# Patient Record
Sex: Male | Born: 1945 | Race: Black or African American | Hispanic: No | Marital: Single | Smoking: Former smoker
Health system: Southern US, Community
[De-identification: ages and names within clinical notes are randomized; demographics above are authoritative.]

## PROBLEM LIST (undated history)

## (undated) DIAGNOSIS — I10 Essential (primary) hypertension: Secondary | ICD-10-CM

## (undated) DIAGNOSIS — H269 Unspecified cataract: Secondary | ICD-10-CM

## (undated) DIAGNOSIS — R339 Retention of urine, unspecified: Secondary | ICD-10-CM

## (undated) HISTORY — PX: OTHER SURGICAL HISTORY: SHX169

## (undated) HISTORY — DX: Unspecified cataract: H26.9

---

## 2014-11-09 ENCOUNTER — Emergency Department (HOSPITAL_COMMUNITY)
Admission: EM | Admit: 2014-11-09 | Discharge: 2014-11-09 | Disposition: A | Discharge status: Home / Self Care | Payer: Commercial Managed Care - HMO | Attending: Emergency Medicine | Admitting: Emergency Medicine

## 2014-11-09 DIAGNOSIS — R339 Retention of urine, unspecified: Secondary | ICD-10-CM

## 2014-11-09 DIAGNOSIS — Z79899 Other long term (current) drug therapy: Secondary | ICD-10-CM | POA: Insufficient documentation

## 2014-11-09 LAB — URINALYSIS, ROUTINE W REFLEX MICROSCOPIC
Bilirubin Urine: NEGATIVE
Glucose, UA: NEGATIVE mg/dL
Ketones, ur: NEGATIVE mg/dL
LEUKOCYTES UA: NEGATIVE
Nitrite: NEGATIVE
Protein, ur: NEGATIVE mg/dL
Specific Gravity, Urine: 1.008 (ref 1.005–1.030)
Urobilinogen, UA: 0.2 mg/dL (ref 0.0–1.0)
pH: 5 (ref 5.0–8.0)

## 2014-11-09 LAB — URINE MICROSCOPIC-ADD ON

## 2014-11-09 NOTE — ED Provider Notes (Signed)
CSN: [id number]     Arrival date & time 11/09/14  1045 History   First MD Initiated Contact with Patient 11/09/14 1049     Chief Complaint  Patient presents with  . Urinary Retention      HPI Per EMS- Patient c/o not urinating since midnight. Patient states he has an appointment at the [facility] on 11-20-14 to have a prostate check.. Patient has a history of HTN and did take his BP meds this AM No past medical history on file. No past surgical history on file. No family history on file. History  Substance Use Topics  . Smoking status: Not on file  . Smokeless tobacco: Not on file  . Alcohol Use: Not on file    Review of Systems All other systems reviewed and are negative   Allergies  Review of patient's allergies indicates no known allergies.  Home Medications   Prior to Admission medications   Medication Sig Start Date End Date Taking? Authorizing Provider  AMLODIPINE BESYLATE PO Take 1 tablet by mouth.   Yes Historical Provider, MD  LISINOPRIL PO Take 1 tablet by mouth daily.   Yes Historical Provider, MD  METOPROLOL TARTRATE PO Take by mouth.   Yes Historical Provider, MD   BP 205/96 mmHg  Pulse 76  Temp(Src) 98.7 F (37.1 C) (Oral)  Resp 16  SpO2 96% Physical Exam Physical Exam  Nursing note and vitals reviewed. Constitutional: He is oriented to person, place, and time. He appears well-developed and well-nourished. No distress.  HENT:  Head: Normocephalic and atraumatic.  Eyes: Pupils are equal, round, and reactive to light.  Neck: Normal range of motion.  Cardiovascular: Normal rate and intact distal pulses.   Pulmonary/Chest: No respiratory distress.  Abdominal: Normal appearance.  Suprapubic distention noted.  No rebound guarding tenderness.  Active bowel sounds. Musculoskeletal: Normal range of motion.  Neurological: He is alert and oriented to person, place, and time. No cranial nerve deficit.  Skin: Skin is warm and dry. No rash noted.  Psychiatric: He has a  normal mood and affect. His behavior is normal.   ED Course  Procedures (including critical care time)  Foley catheter placed.  Greater than 1000 cc of urine returned.  Patient feels much improved.  Wants to go home.  Will leave Foley in place with leg bag.  We'll arrange for follow-up with urology. Labs Review Labs Reviewed  URINALYSIS, ROUTINE W REFLEX MICROSCOPIC - Abnormal; Notable for the following:    Hgb urine dipstick LARGE (*)    All other components within normal limits  URINE MICROSCOPIC-ADD ON      MDM   Final diagnoses:  Urinary retention        Nelia Shi, MD 11/09/14 1227

## 2014-11-09 NOTE — ED Notes (Addendum)
Per EMS- Patient c/o not urinating since midnight. Patient states he has an appointment at the [facility] on 11-20-14 to have a prostate check.. Patient has a history of HTN and did take his BP meds this AM. BP-204/82.

## 2014-11-09 NOTE — Discharge Instructions (Signed)
Acute Urinary Retention °Acute urinary retention is the temporary inability to urinate. °This is a common problem in older men. As men age their prostates become larger and block the flow of urine from the bladder. This is usually a problem that has come on gradually.  °HOME CARE INSTRUCTIONS °If you are sent home with a Foley catheter and a drainage system, you will need to discuss the best course of action with your health care provider. While the catheter is in, maintain a good intake of fluids. Keep the drainage bag emptied and lower than your catheter. This is so that contaminated urine will not flow back into your bladder, which could lead to a urinary tract infection. °There are two main types of drainage bags. One is a large bag that usually is used at night. It has a good capacity that will allow you to sleep through the night without having to empty it. The second type is called a leg bag. It has a smaller capacity, so it needs to be emptied more frequently. However, the main advantage is that it can be attached by a leg strap and can go underneath your clothing, allowing you the freedom to move about or leave your home. °Only take over-the-counter or prescription medicines for pain, discomfort, or fever as directed by your health care provider.  °SEEK MEDICAL CARE IF: °· You develop a low-grade fever. °· You experience spasms or leakage of urine with the spasms. °SEEK IMMEDIATE MEDICAL CARE IF:  °· You develop chills or fever. °· Your catheter stops draining urine. °· Your catheter falls out. °· You start to develop increased bleeding that does not respond to rest and increased fluid intake. °MAKE SURE YOU: °· Understand these instructions. °· Will watch your condition. °· Will get help right away if you are not doing well or get worse. °Document Released: 02/01/2001 Document Revised: 10/31/2013 Document Reviewed: 04/06/2013 °ExitCare® Patient Information ©2015 [employer]. This information is not  intended to replace advice given to you by your health care provider. Make sure you discuss any questions you have with your health care provider. ° °

## 2014-12-03 ENCOUNTER — Encounter (HOSPITAL_COMMUNITY): Payer: Self-pay | Admitting: Emergency Medicine

## 2014-12-03 ENCOUNTER — Emergency Department (HOSPITAL_COMMUNITY)
Admission: EM | Admit: 2014-12-03 | Discharge: 2014-12-03 | Disposition: A | Discharge status: Home / Self Care | Payer: Commercial Managed Care - HMO | Attending: Emergency Medicine | Admitting: Emergency Medicine

## 2014-12-03 DIAGNOSIS — Z79899 Other long term (current) drug therapy: Secondary | ICD-10-CM | POA: Diagnosis not present

## 2014-12-03 DIAGNOSIS — Z720 Tobacco use: Secondary | ICD-10-CM | POA: Diagnosis not present

## 2014-12-03 DIAGNOSIS — I10 Essential (primary) hypertension: Secondary | ICD-10-CM | POA: Insufficient documentation

## 2014-12-03 DIAGNOSIS — Z466 Encounter for fitting and adjustment of urinary device: Secondary | ICD-10-CM

## 2014-12-03 HISTORY — DX: Essential (primary) hypertension: I10

## 2014-12-03 NOTE — Discharge Instructions (Signed)
Foley Catheter Care °A Foley catheter is a soft, flexible tube that is placed into the bladder to drain urine. A Foley catheter may be inserted if: °· You leak urine or are not able to control when you urinate (urinary incontinence). °· You are not able to urinate when you need to (urinary retention). °· You had prostate surgery or surgery on the genitals. °· You have certain medical conditions, such as multiple sclerosis, dementia, or a spinal cord injury. °If you are going home with a Foley catheter in place, follow the instructions below. °TAKING CARE OF THE CATHETER °1. Wash your hands with soap and water. °2. Using mild soap and warm water on a clean washcloth: °· Clean the area on your body closest to the catheter insertion site using a circular motion, moving away from the catheter. Never wipe toward the catheter because this could sweep bacteria up into the urethra and cause infection. °· Remove all traces of soap. Pat the area dry with a clean towel. For males, reposition the foreskin. °3. Attach the catheter to your leg so there is no tension on the catheter. Use adhesive tape or a leg strap. If you are using adhesive tape, remove any sticky residue left behind by the previous tape you used. °4. Keep the drainage bag below the level of the bladder, but keep it off the floor. °5. Check throughout the day to be sure the catheter is working and urine is draining freely. Make sure the tubing does not become kinked. °6. Do not pull on the catheter or try to remove it. Pulling could damage internal tissues. °TAKING CARE OF THE DRAINAGE BAGS °You will be given two drainage bags to take home. One is a large overnight drainage bag, and the other is a smaller leg bag that fits underneath clothing. You may wear the overnight bag at any time, but you should never wear the smaller leg bag at night. Follow the instructions below for how to empty, change, and clean your drainage bags. °Emptying the Drainage Bag °You must  empty your drainage bag when it is  -½ full or at least 2-3 times a day. °1. Wash your hands with soap and water. °2. Keep the drainage bag below your hips, below the level of your bladder. This stops urine from going back into the tubing and into your bladder. °3. Hold the dirty bag over the toilet or a clean container. °4. Open the pour spout at the bottom of the bag and empty the urine into the toilet or container. Do not let the pour spout touch the toilet, container, or any other surface. Doing so can place bacteria on the bag, which can cause an infection. °5. Clean the pour spout with a gauze pad or cotton ball that has rubbing alcohol on it. °6. Close the pour spout. °7. Attach the bag to your leg with adhesive tape or a leg strap. °8. Wash your hands well. °Changing the Drainage Bag °Change your drainage bag once a month or sooner if it starts to smell bad or look dirty. Below are steps to follow when changing the drainage bag. °1. Wash your hands with soap and water. °2. Pinch off the rubber catheter so that urine does not spill out. °3. Disconnect the catheter tube from the drainage tube at the connection valve. Do not let the tubes touch any surface. °4. Clean the end of the catheter tube with an alcohol wipe. Use a different alcohol wipe to clean the   end of the drainage tube. °5. Connect the catheter tube to the drainage tube of the clean drainage bag. °6. Attach the new bag to the leg with adhesive tape or a leg strap. Avoid attaching the new bag too tightly. °7. Wash your hands well. °Cleaning the Drainage Bag °1. Wash your hands with soap and water. °2. Wash the bag in warm, soapy water. °3. Rinse the bag thoroughly with warm water. °4. Fill the bag with a solution of white vinegar and water (1 cup vinegar to 1 qt warm water [.2 L vinegar to 1 L warm water]). Close the bag and soak it for 30 minutes in the solution. °5. Rinse the bag with warm water. °6. Hang the bag to dry with the pour spout open  and hanging downward. °7. Store the clean bag (once it is dry) in a clean plastic bag. °8. Wash your hands well. °PREVENTING INFECTION °· Wash your hands before and after handling your catheter. °· Take showers daily and wash the area where the catheter enters your body. Do not take baths. Replace wet leg straps with dry ones, if this applies. °· Do not use powders, sprays, or lotions on the genital area. Only use creams, lotions, or ointments as directed by your caregiver. °· For females, wipe from front to back after each bowel movement. °· Drink enough fluids to keep your urine clear or pale yellow unless you have a fluid restriction. °· Do not let the drainage bag or tubing touch or lie on the floor. °· Wear cotton underwear to absorb moisture and to keep your skin drier. °SEEK MEDICAL CARE IF:  °· Your urine is cloudy or smells unusually bad. °· Your catheter becomes clogged. °· You are not draining urine into the bag or your bladder feels full. °· Your catheter starts to leak. °SEEK IMMEDIATE MEDICAL CARE IF:  °· You have pain, swelling, redness, or pus where the catheter enters the body. °· You have pain in the abdomen, legs, lower back, or bladder. °· You have a fever. °· You see blood fill the catheter, or your urine is pink or red. °· You have nausea, vomiting, or chills. °· Your catheter gets pulled out. °MAKE SURE YOU:  °· Understand these instructions. °· Will watch your condition. °· Will get help right away if you are not doing well or get worse. °Document Released: 10/26/2005 Document Revised: 03/12/2014 Document Reviewed: 10/17/2012 °ExitCare® Patient Information ©2015 [employer]. This information is not intended to replace advice given to you by your health care provider. Make sure you discuss any questions you have with your health care provider. ° °
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
## 2014-12-03 NOTE — ED Notes (Signed)
[facility]  Pt was seen leaving the department w/ d/c paperwork.

## 2014-12-07 ENCOUNTER — Encounter (HOSPITAL_COMMUNITY): Payer: Self-pay

## 2014-12-07 ENCOUNTER — Emergency Department (HOSPITAL_COMMUNITY)
Admission: EM | Admit: 2014-12-07 | Discharge: 2014-12-07 | Disposition: A | Discharge status: Home / Self Care | Payer: Commercial Managed Care - HMO | Attending: Emergency Medicine | Admitting: Emergency Medicine

## 2014-12-07 DIAGNOSIS — Z720 Tobacco use: Secondary | ICD-10-CM | POA: Diagnosis not present

## 2014-12-07 DIAGNOSIS — R339 Retention of urine, unspecified: Secondary | ICD-10-CM | POA: Diagnosis present

## 2014-12-07 DIAGNOSIS — Z79899 Other long term (current) drug therapy: Secondary | ICD-10-CM | POA: Diagnosis not present

## 2014-12-07 DIAGNOSIS — N390 Urinary tract infection, site not specified: Secondary | ICD-10-CM | POA: Insufficient documentation

## 2014-12-07 DIAGNOSIS — I10 Essential (primary) hypertension: Secondary | ICD-10-CM | POA: Insufficient documentation

## 2014-12-07 HISTORY — DX: Retention of urine, unspecified: R33.9

## 2014-12-07 LAB — CBC WITH DIFFERENTIAL/PLATELET
Basophils Absolute: 0 10*3/uL (ref 0.0–0.1)
Basophils Relative: 1 % (ref 0–1)
Eosinophils Absolute: 0.1 10*3/uL (ref 0.0–0.7)
Eosinophils Relative: 3 % (ref 0–5)
HEMATOCRIT: 38.3 % — AB (ref 39.0–52.0)
HEMOGLOBIN: 12.4 g/dL — AB (ref 13.0–17.0)
LYMPHS PCT: 21 % (ref 12–46)
Lymphs Abs: 0.8 10*3/uL (ref 0.7–4.0)
MCH: 27.8 pg (ref 26.0–34.0)
MCHC: 32.4 g/dL (ref 30.0–36.0)
MCV: 85.9 fL (ref 78.0–100.0)
Monocytes Absolute: 0.4 10*3/uL (ref 0.1–1.0)
Monocytes Relative: 10 % (ref 3–12)
NEUTROS ABS: 2.5 10*3/uL (ref 1.7–7.7)
Neutrophils Relative %: 65 % (ref 43–77)
Platelets: 182 10*3/uL (ref 150–400)
RBC: 4.46 MIL/uL (ref 4.22–5.81)
RDW: 15.7 % — ABNORMAL HIGH (ref 11.5–15.5)
WBC: 3.9 10*3/uL — ABNORMAL LOW (ref 4.0–10.5)

## 2014-12-07 LAB — URINE MICROSCOPIC-ADD ON

## 2014-12-07 LAB — URINALYSIS, ROUTINE W REFLEX MICROSCOPIC
BILIRUBIN URINE: NEGATIVE
Glucose, UA: NEGATIVE mg/dL
KETONES UR: NEGATIVE mg/dL
Leukocytes, UA: NEGATIVE
NITRITE: POSITIVE — AB
PH: 5.5 (ref 5.0–8.0)
Protein, ur: NEGATIVE mg/dL
Specific Gravity, Urine: 1.019 (ref 1.005–1.030)
Urobilinogen, UA: 0.2 mg/dL (ref 0.0–1.0)

## 2014-12-07 LAB — BASIC METABOLIC PANEL
ANION GAP: 8 (ref 5–15)
BUN: 21 mg/dL (ref 6–23)
CALCIUM: 9.1 mg/dL (ref 8.4–10.5)
CHLORIDE: 107 mmol/L (ref 96–112)
CO2: 25 mmol/L (ref 19–32)
CREATININE: 1.1 mg/dL (ref 0.50–1.35)
GFR calc Af Amer: 78 mL/min — ABNORMAL LOW (ref >90)
GFR calc non Af Amer: 68 mL/min — ABNORMAL LOW (ref >90)
GLUCOSE: 94 mg/dL (ref 70–99)
Potassium: 4.1 mmol/L (ref 3.5–5.1)
Sodium: 140 mmol/L (ref 135–145)

## 2014-12-07 MED ORDER — LIDOCAINE HCL 2 % EX GEL
CUTANEOUS | Status: AC
  Filled 2014-12-07: qty 10

## 2014-12-07 MED ORDER — CEPHALEXIN 500 MG PO CAPS: 500.0000 mg | ORAL_CAPSULE | Freq: Two times a day (BID) | ORAL | Status: DC

## 2014-12-07 MED ORDER — CEFTRIAXONE SODIUM 1 G IJ SOLR
1.0000 g | Freq: Once | INTRAMUSCULAR | Status: AC
  Administered 2014-12-07: 1 g via INTRAMUSCULAR
  Filled 2014-12-07: qty 10

## 2014-12-07 MED ORDER — CEFTRIAXONE SODIUM 1 G IJ SOLR: 1.0000 g | Freq: Once | INTRAMUSCULAR | Status: DC

## 2014-12-07 NOTE — ED Notes (Signed)
Bed: [id number] Expected date:  Expected time:  Means of arrival: Ambulance Comments: 69 yo male Urinary symptoms
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
## 2014-12-07 NOTE — ED Notes (Signed)
Leg bag intact/ supplies [facility] at [employer]

## 2014-12-07 NOTE — ED Notes (Addendum)
Patient arrives to facility via EMS after he called reporting that he has not urinated x 4 days.  He denies pain, but says, "I feel some pressure at the end of my joint."  Seen for removal of Foley catheter on 12/03/14.  States it was placed on 11/09/14 for urinary retention.

## 2014-12-07 NOTE — Discharge Instructions (Signed)
°  Take your antibiotics as directed and to completion. You should never have any leftover antibiotics! Push fluids and stay well hydrated.  ° °Any antibiotic use can reduce the efficacy of hormonal birth control. Please use back up method of contraception.  ° °Please follow with your primary care doctor in the next 2 days for a check-up. They must obtain records for further management.  ° °Do not hesitate to return to the Emergency Department for any new, worsening or concerning symptoms.  ° °

## 2014-12-11 ENCOUNTER — Telehealth (HOSPITAL_BASED_OUTPATIENT_CLINIC_OR_DEPARTMENT_OTHER): Payer: Self-pay | Admitting: Emergency Medicine

## 2015-01-02 ENCOUNTER — Emergency Department (HOSPITAL_COMMUNITY)
Admission: EM | Admit: 2015-01-02 | Discharge: 2015-01-02 | Disposition: A | Discharge status: Home / Self Care | Payer: Commercial Managed Care - HMO | Attending: Emergency Medicine | Admitting: Emergency Medicine

## 2015-01-02 ENCOUNTER — Encounter (HOSPITAL_COMMUNITY): Payer: Self-pay | Admitting: Emergency Medicine

## 2015-01-02 DIAGNOSIS — R339 Retention of urine, unspecified: Secondary | ICD-10-CM | POA: Diagnosis not present

## 2015-01-02 DIAGNOSIS — Z720 Tobacco use: Secondary | ICD-10-CM | POA: Insufficient documentation

## 2015-01-02 DIAGNOSIS — Z79899 Other long term (current) drug therapy: Secondary | ICD-10-CM | POA: Insufficient documentation

## 2015-01-02 DIAGNOSIS — I10 Essential (primary) hypertension: Secondary | ICD-10-CM | POA: Insufficient documentation

## 2015-01-02 NOTE — ED Notes (Signed)
Took patient catheter out per MD

## 2015-01-02 NOTE — ED Notes (Addendum)
Pt from home and reports that he was seen at [facility] this a.m. For follow up for acute urinary retention. He reports they refused to see him due to lack of "$45 of copayment" he states "nobody is treating me for this". He denies problems with catheter and was able to empty his drainage bag this am and denies pain.

## 2015-01-02 NOTE — ED Notes (Signed)
I scan patient bladder it had 76ml in it.  I made nurse aware of it.

## 2015-01-02 NOTE — Progress Notes (Signed)
01/02/2015 A. Benno Brensinger RNCM 1617pm EDCM spoke [facility] for social worker for patient.  EDCM spoke to Dr. Littie DeedsGentry who placed order for home health social worker.  Belenda [facility]ristin [facility] made aware.  No further EDCM needs at this time.

## 2015-01-02 NOTE — ED Notes (Addendum)
Patient comes from home with questions about his urinary catheter.  Patient had catheter placed here on 1/29.  Patient went to see [facility] this morning, but they would not see him without a $45 co-pay, which he does not have.  Patient wants to know why he can't pass urine and why he must have a catheter.  Patient denies pain, SOB and dizziness.

## 2015-01-02 NOTE — ED Provider Notes (Signed)
CSN: [id number]     Arrival date & time 01/02/15  6578 History   First MD Initiated Contact with Patient 01/02/15 0848     Chief Complaint  Patient presents with  . catheter issue      (Consider location/radiation/quality/duration/timing/severity/associated sxs/prior Treatment) HPI Comments: 69 y.o. male presents with request to have foley removed.  He had this placed for urinary retention and was to fu with urology.  He went to urology today, but did not have the $45 copay, so presented here.  No abd pain.  No associated fever, nausea, vomiting, diarrhea, or other complaint.  Nothing provokes symptoms.     Past Medical History  Diagnosis Date  . Hypertension   . Urinary retention    Past Surgical History  Procedure Laterality Date  . Peptic ulcer     No family history on file. History  Substance Use Topics  . Smoking status: Current Every Day Smoker -- 0.50 packs/day    Types: Cigarettes  . Smokeless tobacco: Not on file  . Alcohol Use: No    Review of Systems  All other systems reviewed and are negative.     Allergies  Review of patient's allergies indicates no known allergies.  Home Medications   Prior to Admission medications   Medication Sig Start Date End Date Taking? Authorizing Provider  alfuzosin (UROXATRAL) 10 MG 24 hr tablet Take 10 mg by mouth daily with breakfast.   Yes Historical Provider, MD  amLODipine (NORVASC) 10 MG tablet Take 5 mg by mouth daily.   Yes Historical Provider, MD  cetirizine (ZYRTEC) 10 MG tablet Take 10 mg by mouth daily.   Yes Historical Provider, MD  ibuprofen (ADVIL,MOTRIN) 200 MG tablet Take 400 mg by mouth every 6 (six) hours as needed for moderate pain.   Yes Historical Provider, MD  lisinopril (PRINIVIL,ZESTRIL) 40 MG tablet Take 40 mg by mouth daily.   Yes Historical Provider, MD  metoprolol tartrate (LOPRESSOR) 25 MG tablet Take 25 mg by mouth 2 (two) times daily.   Yes Historical Provider, MD  cephALEXin (KEFLEX) 500 MG  capsule Take 1 capsule (500 mg total) by mouth 2 (two) times daily. Patient not taking: Reported on 01/02/2015 12/07/14   Joni Reining Pisciotta, PA-C   BP 169/75 mmHg  Pulse 73  Temp(Src) 98.5 F (36.9 C) (Oral)  Resp 18  SpO2 98% Physical Exam  Constitutional: He is oriented to person, place, and time. He appears well-developed and well-nourished.  HENT:  Head: Normocephalic and atraumatic.  Eyes: Conjunctivae and EOM are normal.  Neck: Normal range of motion. Neck supple.  Cardiovascular: Normal rate, regular rhythm and normal heart sounds.   Pulmonary/Chest: Effort normal and breath sounds normal. No respiratory distress.  Abdominal: He exhibits no distension. There is no tenderness. There is no rebound and no guarding.  Musculoskeletal: Normal range of motion.  Neurological: He is alert and oriented to person, place, and time.  Skin: Skin is warm and dry.  Vitals reviewed.   ED Course  Procedures (including critical care time) Labs Review Labs Reviewed - No data to display  Imaging Review No results found.   EKG Interpretation None      MDM   Final diagnoses:  Urinary retention    69 y.o. male with pertinent PMH of urinary retention with scheduled urology fu presents after he was refused by urology clinic this am.  No symptoms.  I discussed possible options of foley removal and fu vs changing foley today, he elected to  remove.  We specifically discussed possibility of recurrent retention and pt states he would still like to try.  Foley removed, pt spontaneously voided 120 cc here with post void residual shortly thereafter at 76 cc. DC home in stable condition.    I have reviewed all laboratory and imaging studies if ordered as above  1. Urinary retention         Mirian MoMatthew Gentry, MD 01/02/15 [phone]

## 2015-01-02 NOTE — Discharge Planning (Signed)
Cejay Cambre J. Lucretia RoersWood, RN, BSN, [employer] [phone] Spoke with RN regarding [facility]. Offered pt list of home health agencies to choose from.  Pt chose [facility] to render services. [facility] notified.  No DME needs identified at this time.

## 2015-01-02 NOTE — Discharge Instructions (Signed)
Acute Urinary Retention °Acute urinary retention is the temporary inability to urinate. °This is a common problem in older men. As men age their prostates become larger and block the flow of urine from the bladder. This is usually a problem that has come on gradually.  °HOME CARE INSTRUCTIONS °If you are sent home with a Foley catheter and a drainage system, you will need to discuss the best course of action with your health care provider. While the catheter is in, maintain a good intake of fluids. Keep the drainage bag emptied and lower than your catheter. This is so that contaminated urine will not flow back into your bladder, which could lead to a urinary tract infection. °There are two main types of drainage bags. One is a large bag that usually is used at night. It has a good capacity that will allow you to sleep through the night without having to empty it. The second type is called a leg bag. It has a smaller capacity, so it needs to be emptied more frequently. However, the main advantage is that it can be attached by a leg strap and can go underneath your clothing, allowing you the freedom to move about or leave your home. °Only take over-the-counter or prescription medicines for pain, discomfort, or fever as directed by your health care provider.  °SEEK MEDICAL CARE IF: °· You develop a low-grade fever. °· You experience spasms or leakage of urine with the spasms. °SEEK IMMEDIATE MEDICAL CARE IF:  °· You develop chills or fever. °· Your catheter stops draining urine. °· Your catheter falls out. °· You start to develop increased bleeding that does not respond to rest and increased fluid intake. °MAKE SURE YOU: °· Understand these instructions. °· Will watch your condition. °· Will get help right away if you are not doing well or get worse. °Document Released: 02/01/2001 Document Revised: 10/31/2013 Document Reviewed: 04/06/2013 °ExitCare® Patient Information ©2015 [employer]. This information is not  intended to replace advice given to you by your health care provider. Make sure you discuss any questions you have with your health care provider. ° °

## 2015-01-02 NOTE — ED Notes (Signed)
Patient voided 125 ml on his own.  Post void bladder scan revealed 76 ml in his bladder.

## 2015-01-02 NOTE — Progress Notes (Signed)
  01/02/2015  Patient:  Duane Long,Duane Long   Account Number:  [id number]  Date Initiated:  01/02/2015  Documentation initiated by:  [facility],[place] Petron  Subjective/Objective Assessment:   Catheter issues     Subjective/Objective Assessment Detail:   Pt has enlarged prostate and returns to ER to get cathed.     Action/Plan:   Set up home heath RN    Anticipated DC Date:  01/02/2015     Status Recommendation to Physician:   Result of Recommendation:      DC Planning Services  CM consult     HH arranged  HH[facility]      [facility]    Status of service:  Completed, signed off   ED Comments Detail:  NCM [facility] health services to help pt learn to self cath.

## 2015-01-03 NOTE — Progress Notes (Signed)
01/03/15 1341 Clinton GallantKimberly L Gibbs, RN, CCM  ED CM spoke with Baxter HireKristen of [facility] at 1209 about pt concerns with ED care and pt not being able to afford his $45 co pay for [facility]. CM called [phone] States he had is doing well today and has had a visit from [facility] RN today 01/03/15.  CM discussed with him that a HHSW was also ordered and he may receive a call to assist with co pay assistance or community assistance He voiced understanding and appreciation CM attempted to offer Cm office nubbier but pt stated "I'm not in the position right now to take it down.  You can call back and leave on my answering machine." Cm returned the call but the pt picked up, did not answer but CM able to hear him talking with someone in the background for 20 seconds as CM called out his name but unable to get his attention. CM disconnected the line

## 2015-03-14 NOTE — Progress Notes (Signed)
[facility] ED CM in contact with [facility] staff Pt home health services completed.  Pt no longer with pcp.  Discussed with EDP, Gentry.  Forms faxed to [phone] ATTN Dr Littie DeedsGentry Fax confirmation received at 1045 03/14/15.   ED Cm left pt a voice message for pt at [phone] r/t pcp and competed home health service r/t foley Pending a  return call  Baxter HireKristen of [facility] updated at 1055

## 2016-08-03 ENCOUNTER — Encounter (HOSPITAL_COMMUNITY): Payer: Self-pay | Admitting: *Deleted

## 2016-08-03 ENCOUNTER — Observation Stay (HOSPITAL_COMMUNITY): Payer: Medicare Other | Admitting: Certified Registered Nurse Anesthetist

## 2016-08-03 ENCOUNTER — Emergency Department (HOSPITAL_COMMUNITY): Payer: Medicare Other

## 2016-08-03 ENCOUNTER — Ambulatory Visit (HOSPITAL_COMMUNITY)
Admission: EM | Admit: 2016-08-03 | Discharge: 2016-08-04 | Disposition: A | Discharge status: Home / Self Care | Payer: Medicare Other | Attending: General Surgery | Admitting: General Surgery

## 2016-08-03 ENCOUNTER — Encounter (HOSPITAL_COMMUNITY)
Admission: EM | Disposition: A | Discharge status: Home / Self Care | Payer: Self-pay | Source: Home / Self Care | Attending: Emergency Medicine

## 2016-08-03 DIAGNOSIS — F17210 Nicotine dependence, cigarettes, uncomplicated: Secondary | ICD-10-CM | POA: Insufficient documentation

## 2016-08-03 DIAGNOSIS — N400 Enlarged prostate without lower urinary tract symptoms: Secondary | ICD-10-CM | POA: Insufficient documentation

## 2016-08-03 DIAGNOSIS — N390 Urinary tract infection, site not specified: Secondary | ICD-10-CM | POA: Insufficient documentation

## 2016-08-03 DIAGNOSIS — K611 Rectal abscess: Secondary | ICD-10-CM | POA: Diagnosis present

## 2016-08-03 DIAGNOSIS — I10 Essential (primary) hypertension: Secondary | ICD-10-CM | POA: Diagnosis not present

## 2016-08-03 DIAGNOSIS — K603 Anal fistula: Secondary | ICD-10-CM | POA: Insufficient documentation

## 2016-08-03 DIAGNOSIS — K279 Peptic ulcer, site unspecified, unspecified as acute or chronic, without hemorrhage or perforation: Secondary | ICD-10-CM | POA: Insufficient documentation

## 2016-08-03 DIAGNOSIS — K610 Anal abscess: Secondary | ICD-10-CM | POA: Diagnosis present

## 2016-08-03 HISTORY — PX: INCISION AND DRAINAGE PERIRECTAL ABSCESS: SHX1804

## 2016-08-03 LAB — URINALYSIS, ROUTINE W REFLEX MICROSCOPIC
BILIRUBIN URINE: NEGATIVE
Glucose, UA: NEGATIVE mg/dL
KETONES UR: NEGATIVE mg/dL
NITRITE: POSITIVE — AB
PROTEIN: 100 mg/dL — AB
Specific Gravity, Urine: 1.015 (ref 1.005–1.030)
pH: 6 (ref 5.0–8.0)

## 2016-08-03 LAB — CBC WITH DIFFERENTIAL/PLATELET
BASOS ABS: 0 10*3/uL (ref 0.0–0.1)
Basophils Relative: 0 %
EOS ABS: 0 10*3/uL (ref 0.0–0.7)
EOS PCT: 0 %
HCT: 39.7 % (ref 39.0–52.0)
Hemoglobin: 13.1 g/dL (ref 13.0–17.0)
LYMPHS PCT: 7 %
Lymphs Abs: 0.7 10*3/uL (ref 0.7–4.0)
MCH: 28.6 pg (ref 26.0–34.0)
MCHC: 33 g/dL (ref 30.0–36.0)
MCV: 86.7 fL (ref 78.0–100.0)
MONO ABS: 0.8 10*3/uL (ref 0.1–1.0)
Monocytes Relative: 8 %
Neutro Abs: 8.5 10*3/uL — ABNORMAL HIGH (ref 1.7–7.7)
Neutrophils Relative %: 85 %
PLATELETS: 221 10*3/uL (ref 150–400)
RBC: 4.58 MIL/uL (ref 4.22–5.81)
RDW: 14.4 % (ref 11.5–15.5)
WBC: 10.1 10*3/uL (ref 4.0–10.5)

## 2016-08-03 LAB — BASIC METABOLIC PANEL
ANION GAP: 10 (ref 5–15)
BUN: 11 mg/dL (ref 6–20)
CALCIUM: 9.1 mg/dL (ref 8.9–10.3)
CO2: 24 mmol/L (ref 22–32)
Chloride: 100 mmol/L — ABNORMAL LOW (ref 101–111)
Creatinine, Ser: 1.05 mg/dL (ref 0.61–1.24)
Glucose, Bld: 100 mg/dL — ABNORMAL HIGH (ref 65–99)
POTASSIUM: 3.5 mmol/L (ref 3.5–5.1)
SODIUM: 134 mmol/L — AB (ref 135–145)

## 2016-08-03 LAB — URINE MICROSCOPIC-ADD ON

## 2016-08-03 LAB — POC OCCULT BLOOD, ED: FECAL OCCULT BLD: NEGATIVE

## 2016-08-03 SURGERY — INCISION AND DRAINAGE, ABSCESS, PERIRECTAL
Anesthesia: General | Site: Perineum

## 2016-08-03 MED ORDER — SODIUM CHLORIDE 0.9 % IV SOLN
INTRAVENOUS | Status: DC | PRN
  Administered 2016-08-03: 16:00:00 via INTRAVENOUS

## 2016-08-03 MED ORDER — NICOTINE 21 MG/24HR TD PT24
21.0000 mg | MEDICATED_PATCH | Freq: Every day | TRANSDERMAL | Status: DC
  Administered 2016-08-03 – 2016-08-04 (×2): 21 mg via TRANSDERMAL
  Filled 2016-08-03 (×2): qty 1

## 2016-08-03 MED ORDER — OXYCODONE HCL 5 MG/5ML PO SOLN: 5.0000 mg | Freq: Once | ORAL | Status: DC | PRN

## 2016-08-03 MED ORDER — KETOROLAC TROMETHAMINE 15 MG/ML IJ SOLN: 15.0000 mg | Freq: Four times a day (QID) | INTRAMUSCULAR | Status: DC | PRN

## 2016-08-03 MED ORDER — DEXTROSE IN LACTATED RINGERS 5 % IV SOLN: INTRAVENOUS | Status: AC

## 2016-08-03 MED ORDER — PIPERACILLIN-TAZOBACTAM 3.375 G IVPB
3.3750 g | Freq: Four times a day (QID) | INTRAVENOUS | Status: DC
  Filled 2016-08-03 (×2): qty 50

## 2016-08-03 MED ORDER — MINERAL OIL RE ENEM
1.0000 | ENEMA | Freq: Once | RECTAL | Status: AC
  Administered 2016-08-03: 1 via RECTAL
  Filled 2016-08-03: qty 1

## 2016-08-03 MED ORDER — ACETAMINOPHEN 650 MG RE SUPP: 650.0000 mg | Freq: Four times a day (QID) | RECTAL | Status: DC | PRN

## 2016-08-03 MED ORDER — LISINOPRIL 40 MG PO TABS
40.0000 mg | ORAL_TABLET | Freq: Every day | ORAL | Status: DC
  Administered 2016-08-03 – 2016-08-04 (×2): 40 mg via ORAL
  Filled 2016-08-03 (×2): qty 1

## 2016-08-03 MED ORDER — FENTANYL CITRATE (PF) 100 MCG/2ML IJ SOLN
INTRAMUSCULAR | Status: DC | PRN
Start: 2016-08-03 — End: 2016-08-03
  Administered 2016-08-03 (×4): 50 ug via INTRAVENOUS

## 2016-08-03 MED ORDER — LORATADINE 10 MG PO TABS
10.0000 mg | ORAL_TABLET | Freq: Every day | ORAL | Status: DC
  Administered 2016-08-04: 10 mg via ORAL
  Filled 2016-08-03: qty 1

## 2016-08-03 MED ORDER — HYDROMORPHONE HCL 1 MG/ML IJ SOLN: 0.2500 mg | INTRAMUSCULAR | Status: DC | PRN

## 2016-08-03 MED ORDER — CLINDAMYCIN PHOSPHATE 600 MG/50ML IV SOLN
600.0000 mg | Freq: Once | INTRAVENOUS | Status: AC
  Administered 2016-08-03: 600 mg via INTRAVENOUS
  Filled 2016-08-03: qty 50

## 2016-08-03 MED ORDER — ALFUZOSIN HCL ER 10 MG PO TB24
10.0000 mg | ORAL_TABLET | Freq: Every day | ORAL | Status: DC
Start: 2016-08-04 — End: 2016-08-04
  Administered 2016-08-04: 10 mg via ORAL
  Filled 2016-08-03: qty 1

## 2016-08-03 MED ORDER — PROPOFOL 10 MG/ML IV BOLUS
INTRAVENOUS | Status: DC | PRN
  Administered 2016-08-03: 200 mg via INTRAVENOUS

## 2016-08-03 MED ORDER — MIDAZOLAM HCL 2 MG/2ML IJ SOLN
INTRAMUSCULAR | Status: AC
  Filled 2016-08-03: qty 2

## 2016-08-03 MED ORDER — ACETAMINOPHEN 325 MG PO TABS
650.0000 mg | ORAL_TABLET | Freq: Four times a day (QID) | ORAL | Status: DC | PRN
  Administered 2016-08-04: 650 mg via ORAL
  Filled 2016-08-03: qty 2

## 2016-08-03 MED ORDER — 0.9 % SODIUM CHLORIDE (POUR BTL) OPTIME
TOPICAL | Status: DC | PRN
Start: 2016-08-03 — End: 2016-08-03
  Administered 2016-08-03: 1000 mL

## 2016-08-03 MED ORDER — MORPHINE SULFATE (PF) 4 MG/ML IV SOLN: 4.0000 mg | Freq: Once | INTRAVENOUS | Status: DC

## 2016-08-03 MED ORDER — ONDANSETRON HCL 4 MG/2ML IJ SOLN
INTRAMUSCULAR | Status: DC | PRN
  Administered 2016-08-03: 4 mg via INTRAVENOUS

## 2016-08-03 MED ORDER — CEFAZOLIN SODIUM 1 G IJ SOLR
INTRAMUSCULAR | Status: DC | PRN
  Administered 2016-08-03: 2 g via INTRAMUSCULAR

## 2016-08-03 MED ORDER — IOPAMIDOL (ISOVUE-300) INJECTION 61%
INTRAVENOUS | Status: AC
  Administered 2016-08-03: 75 mL via INTRAVENOUS
  Filled 2016-08-03: qty 100

## 2016-08-03 MED ORDER — METOPROLOL TARTRATE 25 MG PO TABS
25.0000 mg | ORAL_TABLET | Freq: Once | ORAL | Status: AC
  Administered 2016-08-03: 25 mg via ORAL
  Filled 2016-08-03: qty 1

## 2016-08-03 MED ORDER — ACETAMINOPHEN 325 MG PO TABS: 650.0000 mg | ORAL_TABLET | Freq: Four times a day (QID) | ORAL | Status: DC | PRN

## 2016-08-03 MED ORDER — DEXTROSE 5 % IV SOLN
2.0000 g | Freq: Two times a day (BID) | INTRAVENOUS | Status: AC
  Administered 2016-08-03: 2 g via INTRAVENOUS
  Filled 2016-08-03: qty 2

## 2016-08-03 MED ORDER — POLYETHYLENE GLYCOL 3350 17 G PO PACK: 17.0000 g | PACK | Freq: Every day | ORAL | Status: DC | PRN

## 2016-08-03 MED ORDER — ENOXAPARIN SODIUM 40 MG/0.4ML ~~LOC~~ SOLN
40.0000 mg | SUBCUTANEOUS | Status: DC
  Filled 2016-08-03: qty 0.4

## 2016-08-03 MED ORDER — DIPHENHYDRAMINE HCL 50 MG/ML IJ SOLN: 12.5000 mg | Freq: Four times a day (QID) | INTRAMUSCULAR | Status: DC | PRN

## 2016-08-03 MED ORDER — ROCURONIUM BROMIDE 10 MG/ML (PF) SYRINGE
PREFILLED_SYRINGE | INTRAVENOUS | Status: AC
  Filled 2016-08-03: qty 10

## 2016-08-03 MED ORDER — LACTATED RINGERS IV SOLN
INTRAVENOUS | Status: DC
Start: 2016-08-03 — End: 2016-08-03
  Administered 2016-08-03: 16:00:00 via INTRAVENOUS

## 2016-08-03 MED ORDER — MIDAZOLAM HCL 5 MG/5ML IJ SOLN
INTRAMUSCULAR | Status: DC | PRN
  Administered 2016-08-03: 2 mg via INTRAVENOUS

## 2016-08-03 MED ORDER — ONDANSETRON HCL 4 MG/2ML IJ SOLN: 4.0000 mg | Freq: Four times a day (QID) | INTRAMUSCULAR | Status: DC | PRN

## 2016-08-03 MED ORDER — ONDANSETRON 4 MG PO TBDP: 4.0000 mg | ORAL_TABLET | Freq: Four times a day (QID) | ORAL | Status: DC | PRN

## 2016-08-03 MED ORDER — MORPHINE SULFATE (PF) 2 MG/ML IV SOLN
2.0000 mg | Freq: Once | INTRAVENOUS | Status: AC
  Administered 2016-08-03: 2 mg via INTRAVENOUS
  Filled 2016-08-03: qty 1

## 2016-08-03 MED ORDER — AMLODIPINE BESYLATE 5 MG PO TABS
5.0000 mg | ORAL_TABLET | Freq: Every day | ORAL | Status: DC
  Administered 2016-08-04: 5 mg via ORAL
  Filled 2016-08-03: qty 1

## 2016-08-03 MED ORDER — OXYCODONE HCL 5 MG PO TABS: 5.0000 mg | ORAL_TABLET | ORAL | Status: DC | PRN

## 2016-08-03 MED ORDER — MORPHINE SULFATE (PF) 2 MG/ML IV SOLN: 1.0000 mg | INTRAVENOUS | Status: DC | PRN

## 2016-08-03 MED ORDER — PROPOFOL 10 MG/ML IV BOLUS
INTRAVENOUS | Status: AC
  Filled 2016-08-03: qty 40

## 2016-08-03 MED ORDER — METOPROLOL TARTRATE 25 MG PO TABS
25.0000 mg | ORAL_TABLET | Freq: Two times a day (BID) | ORAL | Status: DC
  Administered 2016-08-04: 25 mg via ORAL
  Filled 2016-08-03: qty 1

## 2016-08-03 MED ORDER — OXYCODONE HCL 5 MG PO TABS
5.0000 mg | ORAL_TABLET | ORAL | Status: DC | PRN
  Administered 2016-08-04: 10 mg via ORAL
  Filled 2016-08-03: qty 2

## 2016-08-03 MED ORDER — MORPHINE SULFATE (PF) 2 MG/ML IV SOLN
1.0000 mg | INTRAVENOUS | Status: DC | PRN
  Administered 2016-08-03: 2 mg via INTRAVENOUS
  Filled 2016-08-03: qty 1

## 2016-08-03 MED ORDER — METHOCARBAMOL 500 MG PO TABS: 500.0000 mg | ORAL_TABLET | Freq: Four times a day (QID) | ORAL | Status: DC | PRN

## 2016-08-03 MED ORDER — FLEET ENEMA 7-19 GM/118ML RE ENEM: 1.0000 | ENEMA | Freq: Once | RECTAL | Status: DC | PRN

## 2016-08-03 MED ORDER — ONDANSETRON HCL 4 MG/2ML IJ SOLN: 4.0000 mg | Freq: Once | INTRAMUSCULAR | Status: DC | PRN

## 2016-08-03 MED ORDER — FENTANYL CITRATE (PF) 100 MCG/2ML IJ SOLN
INTRAMUSCULAR | Status: AC
  Filled 2016-08-03: qty 2

## 2016-08-03 MED ORDER — DEXTROSE 5 % IV SOLN: 1.0000 g | Freq: Once | INTRAVENOUS | Status: DC

## 2016-08-03 MED ORDER — SENNA 8.6 MG PO TABS
1.0000 | ORAL_TABLET | Freq: Two times a day (BID) | ORAL | Status: DC
  Administered 2016-08-03 – 2016-08-04 (×2): 8.6 mg via ORAL
  Filled 2016-08-03 (×2): qty 1

## 2016-08-03 MED ORDER — DIPHENHYDRAMINE HCL 12.5 MG/5ML PO ELIX: 12.5000 mg | ORAL_SOLUTION | Freq: Four times a day (QID) | ORAL | Status: DC | PRN

## 2016-08-03 MED ORDER — SODIUM CHLORIDE 0.9 % IR SOLN
Status: DC | PRN
  Administered 2016-08-03 (×2): 1000 mL

## 2016-08-03 MED ORDER — LIDOCAINE HCL (CARDIAC) 20 MG/ML IV SOLN
INTRAVENOUS | Status: DC | PRN
  Administered 2016-08-03: 100 mg via INTRAVENOUS

## 2016-08-03 MED ORDER — SODIUM CHLORIDE 0.45 % IV SOLN
INTRAVENOUS | Status: DC
  Administered 2016-08-03: 20:00:00 via INTRAVENOUS

## 2016-08-03 MED ORDER — LISINOPRIL 20 MG PO TABS
40.0000 mg | ORAL_TABLET | Freq: Once | ORAL | Status: AC
  Administered 2016-08-03: 40 mg via ORAL
  Filled 2016-08-03: qty 2

## 2016-08-03 MED ORDER — BISACODYL 10 MG RE SUPP: 10.0000 mg | Freq: Every day | RECTAL | Status: DC | PRN

## 2016-08-03 MED ORDER — MEPERIDINE HCL 25 MG/ML IJ SOLN: 6.2500 mg | INTRAMUSCULAR | Status: DC | PRN

## 2016-08-03 MED ORDER — FAMOTIDINE IN NACL 20-0.9 MG/50ML-% IV SOLN
20.0000 mg | Freq: Two times a day (BID) | INTRAVENOUS | Status: DC
  Administered 2016-08-03: 20 mg via INTRAVENOUS
  Filled 2016-08-03 (×4): qty 50

## 2016-08-03 MED ORDER — AMLODIPINE BESYLATE 5 MG PO TABS
10.0000 mg | ORAL_TABLET | Freq: Once | ORAL | Status: AC
  Administered 2016-08-03: 10 mg via ORAL
  Filled 2016-08-03: qty 2

## 2016-08-03 MED ORDER — OXYCODONE HCL 5 MG PO TABS: 5.0000 mg | ORAL_TABLET | Freq: Once | ORAL | Status: DC | PRN

## 2016-08-03 SURGICAL SUPPLY — 41 items
BLADE SURG 15 STRL LF DISP TIS (BLADE) ×1 IMPLANT
BLADE SURG 15 STRL SS (BLADE) ×2
BRIEF STRETCH FOR OB PAD LRG (UNDERPADS AND DIAPERS) ×3 IMPLANT
CANISTER SUCTION 2500CC (MISCELLANEOUS) ×3 IMPLANT
COVER SURGICAL LIGHT HANDLE (MISCELLANEOUS) ×3 IMPLANT
DRAPE LAPAROSCOPIC ABDOMINAL (DRAPES) IMPLANT
DRAPE LAPAROTOMY T 98X78 PEDS (DRAPES) IMPLANT
DRAPE UTILITY XL STRL (DRAPES) IMPLANT
DRSG PAD ABDOMINAL 8X10 ST (GAUZE/BANDAGES/DRESSINGS) ×3 IMPLANT
ELECT CAUTERY BLADE 6.4 (BLADE) ×3 IMPLANT
ELECT REM PT RETURN 9FT ADLT (ELECTROSURGICAL) ×3
ELECTRODE REM PT RTRN 9FT ADLT (ELECTROSURGICAL) ×1 IMPLANT
GAUZE IODOFORM PACK 1/2 7832 (GAUZE/BANDAGES/DRESSINGS) IMPLANT
GAUZE PACKING IODOFORM 1/4X15 (GAUZE/BANDAGES/DRESSINGS) IMPLANT
GAUZE PACKING IODOFORM 1X5 (MISCELLANEOUS) ×3 IMPLANT
GAUZE SPONGE 4X4 12PLY STRL (GAUZE/BANDAGES/DRESSINGS) IMPLANT
GLOVE BIO SURGEON STRL SZ 6 (GLOVE) ×3 IMPLANT
GLOVE BIOGEL PI IND STRL 6.5 (GLOVE) ×1 IMPLANT
GLOVE BIOGEL PI INDICATOR 6.5 (GLOVE) ×2
GOWN STRL REUS W/ TWL LRG LVL3 (GOWN DISPOSABLE) ×1 IMPLANT
GOWN STRL REUS W/TWL 2XL LVL3 (GOWN DISPOSABLE) ×3 IMPLANT
GOWN STRL REUS W/TWL LRG LVL3 (GOWN DISPOSABLE) ×2
HANDPIECE INTERPULSE COAX TIP (DISPOSABLE) ×2
KIT BASIN OR (CUSTOM PROCEDURE TRAY) ×3 IMPLANT
KIT ROOM TURNOVER OR (KITS) ×3 IMPLANT
LOOP VESSEL MAXI BLUE (MISCELLANEOUS) ×3 IMPLANT
NS IRRIG 1000ML POUR BTL (IV SOLUTION) ×3 IMPLANT
PACK LITHOTOMY IV (CUSTOM PROCEDURE TRAY) ×3 IMPLANT
PACK SURGICAL SETUP 50X90 (CUSTOM PROCEDURE TRAY) IMPLANT
PAD ARMBOARD 7.5X6 YLW CONV (MISCELLANEOUS) ×3 IMPLANT
PENCIL BUTTON HOLSTER BLD 10FT (ELECTRODE) ×3 IMPLANT
SET HNDPC FAN SPRY TIP SCT (DISPOSABLE) ×1 IMPLANT
SPONGE LAP 18X18 X RAY DECT (DISPOSABLE) ×3 IMPLANT
SWAB COLLECTION DEVICE MRSA (MISCELLANEOUS) ×3 IMPLANT
TOWEL OR 17X24 6PK STRL BLUE (TOWEL DISPOSABLE) IMPLANT
TOWEL OR 17X26 10 PK STRL BLUE (TOWEL DISPOSABLE) ×3 IMPLANT
TUBE ANAEROBIC SPECIMEN COL (MISCELLANEOUS) ×3 IMPLANT
TUBE CONNECTING 12'X1/4 (SUCTIONS) ×1
TUBE CONNECTING 12X1/4 (SUCTIONS) ×2 IMPLANT
UNDERPAD 30X30 (UNDERPADS AND DIAPERS) ×3 IMPLANT
YANKAUER SUCT BULB TIP NO VENT (SUCTIONS) ×3 IMPLANT

## 2016-08-03 NOTE — Anesthesia Procedure Notes (Addendum)
Procedure Name: LMA Insertion Performed by: Margaree MackintoshYACOUB, Merla Sawka B Pre-anesthesia Checklist: Patient identified, Emergency Drugs available, Suction available, Patient being monitored and Timeout performed Patient Re-evaluated:Patient Re-evaluated prior to inductionOxygen Delivery Method: Circle system utilized Preoxygenation: Pre-oxygenation with 100% oxygen Intubation Type: IV induction LMA: LMA inserted LMA Size: 5.0 Number of attempts: 1 Placement Confirmation: positive ETCO2 and breath sounds checked- equal and bilateral Tube secured with: Tape Dental Injury: Teeth and Oropharynx as per pre-operative assessment

## 2016-08-03 NOTE — Transfer of Care (Signed)
Immediate Anesthesia Transfer of Care Note  Patient: Duane GougeMarvin L Long  Procedure(s) Performed: Procedure(s): IRRIGATION AND DEBRIDEMENT PERIRECTAL ABSCESS (N/A)  Patient Location: PACU  Anesthesia Type:General  Level of Consciousness: awake, alert , oriented and patient cooperative  Airway & Oxygen Therapy: Patient Spontanous Breathing and Patient connected to nasal cannula oxygen  Post-op Assessment: Report given to RN, Post -op Vital signs reviewed and stable and Patient moving all extremities X 4  Post vital signs: Reviewed and stable  Last Vitals:  Vitals:   08/03/16 1445 08/03/16 1659  BP: 165/82   Pulse: 77   Resp: 16   Temp:  (P) 37.4 C    Last Pain:  Vitals:   08/03/16 1055  TempSrc:   PainSc: 10-Worst pain ever         Complications: No apparent anesthesia complications

## 2016-08-03 NOTE — Op Note (Signed)
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  No fluctuance present externally.  CT revealed deeper abscess and suspected fistula in ano.    Anesthesia: General   Procedure Details  The procedure, risks and complications have been discussed in detail (including, infection, bleeding, need for additional procedures) with the patient, and the patient has signed consent to the procedure. The patient was informed that the wound would be left open.  The patient was taken to OR 1 and general anesthesia was induced. The patient was placed into lithotomy position.  The skin was sterilely prepped and draped over the affected area in the usual fashion. Time out was performed according to the surgical safety checklist.   I&D with a #11 blade was performed on the left perirectal region in the area of induration.  Cultures were sent.  Purulent drainage was present. The pulse lavage was used to irrigate the cavity. With irrigation, murky fluid came out the anus.  An anoscope was used to locate the fistula.  This was directly posterior.  A blue vessel loop was placed in the fistula and pulled out the posterior aspect of the incision.   The wound was packed with 1" nugauze.  The patient was awakened from anesthesia and taken to the PACU in stable condition.   Findings:  Large abscess left perirectal region.  2 o'clock to 5:30 o'clock  EBL: <25 cc's   Drains: None   Condition: Tolerated procedure well   Complications:  none known.

## 2016-08-03 NOTE — H&P (Signed)
[facility] Admission Note  Duane Long 1946/07/04  [id number].    Requesting MD: Dr. Dayna Barker Chief Complaint/Reason for Consult: perianal abscess HPI:  70 year old gentelman with PMH of HTN, peptic ulcer disease, and urinary retention who presented to [facility] with rectal pain that started about 4 days ago. Thought that he was constipated but he was able to have a normal BM yesterday. Pain is worse with sitting and with BM's. No prior history of perianal abscess. Denies alleviating factors. Denies fever, chills, sick contacts, nausea, vomiting, diarrhea, abdominal pain, melena, or hematochezia. He also reports dysuria and urinary urgency. Last BM was today in the ED after enema. Last meal was yesterday.  ED workup: UA - positive for leukocytes and nitrites WBC - 10.1 Occult blood negative  CT: Tubular perianal fistula/abscess collection, serpiginous in configuration, located mostly to the left of midline, abscess component measuring up to 1.2 cm thickness (measured on series 3, image 42). Overall, the combination of fistula/abscess and interposed areas of soft tissue thickening measure 5.7 x 3.5 cm (measured on series 3, images 41 and 43).  Has had surgery for peptic ulcer in the past  Denies regular use of blood thinning medications. Smokes 1 PPD  ROS: All systems reviewed and otherwise negative except for as above  No family history on file.  Past Medical History:  Diagnosis Date  . Hypertension   . Urinary retention     Past Surgical History:  Procedure Laterality Date  . peptic ulcer      Social History:  reports that he has been smoking Cigarettes.  He has been smoking about 0.50 packs per day. He has never used smokeless tobacco. He reports that he uses drugs, including Marijuana. He reports that he does not drink alcohol.  Allergies: No Known Allergies   (Not in a hospital admission)  Blood pressure 181/83, pulse 65, temperature 98.3 F (36.8 C),  temperature source Oral, resp. rate 20, height 5' 8"  (1.727 m), weight 65.8 kg (145 lb), SpO2 100 %.  Physical Exam: General: pleasant, WD/WN AA male who is walking around his exam room in NAD HEENT: head is normocephalic, atraumatic.   Heart: regular, rate, and rhythm.  No obvious murmurs, gallops, or rubs noted.  Palpable pedal pulses bilaterally Lungs: CTAB, no wheezes, rhonchi, or rales noted.  Respiratory effort nonlabored Abd: soft, NT/ND, +BS, no masses, hernias, or organomegaly MS: all 4 extremities are symmetrical with no cyanosis, clubbing, or edema. Skin: warm and dry with no masses, lesions, or rashes Psych: A&Ox3 with an appropriate affect. Perianal area: no open wounds or drainage, slightly erythematous area that is exquisitely tender to palpation left glut near proximal aspect of gluteal cleft  Results for orders placed or performed during the hospital encounter of 08/03/16 (from the past 48 hour(s))  POC occult blood, ED     Status: None   Collection Time: 08/03/16  8:43 AM  Result Value Ref Range   Fecal Occult Bld NEGATIVE NEGATIVE  CBC with Differential     Status: Abnormal   Collection Time: 08/03/16  9:10 AM  Result Value Ref Range   WBC 10.1 4.0 - 10.5 K/uL   RBC 4.58 4.22 - 5.81 MIL/uL   Hemoglobin 13.1 13.0 - 17.0 g/dL   HCT 39.7 39.0 - 52.0 %   MCV 86.7 78.0 - 100.0 fL   MCH 28.6 26.0 - 34.0 pg   MCHC 33.0 30.0 - 36.0 g/dL   RDW 14.4 11.5 - 15.5 %  Platelets 221 150 - 400 K/uL   Neutrophils Relative % 85 %   Neutro Abs 8.5 (H) 1.7 - 7.7 K/uL   Lymphocytes Relative 7 %   Lymphs Abs 0.7 0.7 - 4.0 K/uL   Monocytes Relative 8 %   Monocytes Absolute 0.8 0.1 - 1.0 K/uL   Eosinophils Relative 0 %   Eosinophils Absolute 0.0 0.0 - 0.7 K/uL   Basophils Relative 0 %   Basophils Absolute 0.0 0.0 - 0.1 K/uL  Basic metabolic panel     Status: Abnormal   Collection Time: 08/03/16  9:10 AM  Result Value Ref Range   Sodium 134 (L) 135 - 145 mmol/L   Potassium 3.5  3.5 - 5.1 mmol/L   Chloride 100 (L) 101 - 111 mmol/L   CO2 24 22 - 32 mmol/L   Glucose, Bld 100 (H) 65 - 99 mg/dL   BUN 11 6 - 20 mg/dL   Creatinine, Ser 1.05 0.61 - 1.24 mg/dL   Calcium 9.1 8.9 - 10.3 mg/dL   GFR calc non Af Amer >60 >60 mL/min   GFR calc Af Amer >60 >60 mL/min    Comment: (NOTE) The eGFR has been calculated using the CKD EPI equation. This calculation has not been validated in all clinical situations. eGFR's persistently <60 mL/min signify possible Chronic Kidney Disease.    Anion gap 10 5 - 15  Urinalysis, Routine w reflex microscopic (not at [facility])     Status: Abnormal   Collection Time: 08/03/16  9:10 AM  Result Value Ref Range   Color, Urine YELLOW YELLOW   APPearance CLEAR CLEAR   Specific Gravity, Urine 1.015 1.005 - 1.030   pH 6.0 5.0 - 8.0   Glucose, UA NEGATIVE NEGATIVE mg/dL   Hgb urine dipstick LARGE (A) NEGATIVE   Bilirubin Urine NEGATIVE NEGATIVE   Ketones, ur NEGATIVE NEGATIVE mg/dL   Protein, ur 100 (A) NEGATIVE mg/dL   Nitrite POSITIVE (A) NEGATIVE   Leukocytes, UA SMALL (A) NEGATIVE  Urine microscopic-add on     Status: Abnormal   Collection Time: 08/03/16  9:10 AM  Result Value Ref Range   Squamous Epithelial / LPF 0-5 (A) NONE SEEN   WBC, UA 6-30 0 - 5 WBC/hpf   RBC / HPF 6-30 0 - 5 RBC/hpf   Bacteria, UA MANY (A) NONE SEEN   Ct Pelvis W Contrast  Result Date: 08/03/2016 CLINICAL DATA:  Severe anal pain for 4 days. EXAM: CT PELVIS WITH CONTRAST TECHNIQUE: Multidetector CT imaging of the pelvis was performed using the standard protocol following the bolus administration of intravenous contrast. CONTRAST:  75 cc ISOVUE-300 IOPAMIDOL (ISOVUE-300) INJECTION 61% COMPARISON:  None. FINDINGS: Urinary Tract:  No abnormality visualized. Bowel: Tubular collection of complex fluid and air extending in serpiginous fashion from the anal verge, mostly to the left of midline, consistent with anal fistula/abscess with interposed areas of soft tissue  thickening. The abscess component measures up to 1.2 cm thickness, with overall course measuring approximately 5.7 cm AP dimension and with extension to the left of midline measuring approximately 3.5 cm. The intrapelvic portion of the bowel appears nonobstructive. Vascular/Lymphatic: No pathologically enlarged lymph nodes. No significant vascular abnormality seen. Prominent atherosclerotic changes noted along the course of the the iliac arteries bilaterally. Reproductive:  No mass or other significant abnormality Other: No intrapelvic abscess collection extension or free fluid. No free intraperitoneal air seen. Musculoskeletal: No acute or suspicious osseous lesions identified. IMPRESSION: 1. Tubular perianal fistula/abscess collection, serpiginous in configuration,  located mostly to the left of midline, abscess component measuring up to 1.2 cm thickness (measured on series 3, image 42). Overall, the combination of fistula/abscess and interposed areas of soft tissue thickening measure 5.7 x 3.5 cm (measured on series 3, images 41 and 43). 2. No intrapelvic extension. No evidence of associated bowel obstruction. Electronically Signed   By: Franki Cabot M.D.   On: 08/03/2016 12:21   Assessment/Plan Perianal abscess - CT: Tubular perianal fistula/abscess collection, serpiginous in configuration, located mostly to the left of midline, abscess component measuring up to 1.2 cm thickness (measured on series 3, image 42). Overall, the combination of fistula/abscess and interposed areas of soft tissue thickening measure 5.7 x 3.5 cm (measured on series 3, images 41 and 43). - no prior history of perianal abscess - WBC - 10.1 - Occult blood negative  - will admit to med-surg and plan for I&D in OR today or tomorrow  Urinary tract infection - UA - positive for leukocytes and nitrites - urine culture pending - received dose of clindamycin in ED this morning  Hypertension - home medications (norvasc, lisinopril,  metoprolol) received in ED this a.m., continue home meds tomorrow PUD - pepcid BPH - continue alfuzosin  Plan: admit to med-surg for I&D in OR today or tomorrow. NPO. Single dose clindamycin received in ED this morning, will start on zosyn q6.   Jerrye Beavers, [facility] 08/03/2016, 1:22 PM Pager: [phone] Consults: [phone] Mon-Fri 7:00 am-4:30 pm Sat-Sun 7:00 am-11:30 am

## 2016-08-03 NOTE — ED Notes (Signed)
Evangeline GulaBrooke Miller, PA at bedside. Pt requesting a urinary catheter stating that he cannot urinate. Pt has urinated 500cc of urine in urinal at bedside but states that he is having trouble urinating at this time. PA at bedside advised this RN to scan patient's bladder and stated she would let the MD know about patient's complaint

## 2016-08-03 NOTE — ED Notes (Signed)
OR advised they will call when they are ready for patient to come up

## 2016-08-03 NOTE — Anesthesia Preprocedure Evaluation (Signed)
Anesthesia Evaluation  Patient identified by MRN, date of birth, ID band Patient awake    Reviewed: Allergy & Precautions, NPO status , Patient's Chart, lab work & pertinent test results  Airway Mallampati: I  TM Distance: >3 FB Neck ROM: Full    Dental  (+) Poor Dentition, Loose   Pulmonary Current Smoker,    breath sounds clear to auscultation       Cardiovascular hypertension, Pt. on medications and Pt. on home beta blockers  Rhythm:Regular Rate:Normal     Neuro/Psych    GI/Hepatic   Endo/Other    Renal/GU      Musculoskeletal   Abdominal   Peds  Hematology   Anesthesia Other Findings   Reproductive/Obstetrics                             Anesthesia Physical Anesthesia Plan  ASA: III  Anesthesia Plan: General   Post-op Pain Management:    Induction: Intravenous  Airway Management Planned: LMA  Additional Equipment:   Intra-op Plan:   Post-operative Plan: Extubation in OR  Informed Consent: I have reviewed the patients History and Physical, chart, labs and discussed the procedure including the risks, benefits and alternatives for the proposed anesthesia with the patient or authorized representative who has indicated his/her understanding and acceptance.   Dental advisory given  Plan Discussed with: CRNA, Anesthesiologist and Surgeon  Anesthesia Plan Comments:         Anesthesia Quick Evaluation

## 2016-08-03 NOTE — ED Notes (Signed)
Patient transported to CT 

## 2016-08-03 NOTE — ED Provider Notes (Signed)
[facility]-EMER[facility]ENCY DEPT Provider Note   CSN: [id number] Arrival date & time: 08/03/16  4098     History   Chief Complaint Chief Complaint  Patient presents with  . Rectal Pain    HPI NEVAEH Long is a 70 y.o. male.  HPI Duane Long is a 70 y.o. male with PMH significant for HTN and urinary retention who presents with rectal pain.  Patient states last BM 4 days ago, normal for him.  He states since then he has felt the urge to have a BM, but has been unable to.  He is not on a daily fiber/constipation prevention regimen.  States this has happened before in the past, but it usually resolves.  He has not tried anything for his symptoms.  It is painful to sit.  Nothing makes it better.  He denies fever, chills, N/V/D, bloody stools, abdominal pain, or urinary symptoms. He denies any changes to medications, opioid use  Past Medical History:  Diagnosis Date  . Hypertension   . Urinary retention     Patient Active Problem List   Diagnosis Date Noted  . Perianal abscess 08/03/2016    Past Surgical History:  Procedure Laterality Date  . peptic ulcer         Home Medications    Prior to Admission medications   Medication Sig Start Date End Date Taking? Authorizing Provider  alfuzosin (UROXATRAL) 10 MG 24 hr tablet Take 10 mg by mouth daily with breakfast.   Yes Historical Provider, MD  amLODipine (NORVASC) 10 MG tablet Take 5 mg by mouth daily.   Yes Historical Provider, MD  cetirizine (ZYRTEC) 10 MG tablet Take 10 mg by mouth daily.   Yes Historical Provider, MD  ibuprofen (ADVIL,MOTRIN) 200 MG tablet Take 400 mg by mouth every 6 (six) hours as needed for moderate pain.   Yes Historical Provider, MD  lisinopril (PRINIVIL,ZESTRIL) 40 MG tablet Take 40 mg by mouth daily.   Yes Historical Provider, MD  metoprolol tartrate (LOPRESSOR) 25 MG tablet Take 25 mg by mouth 2 (two) times daily.   Yes Historical Provider, MD  cephALEXin (KEFLEX) 500 MG capsule Take 1 capsule (500 mg  total) by mouth 2 (two) times daily. Patient not taking: Reported on 08/03/2016 12/07/14   Wynetta Emery, PA-C    Family History No family history on file.  Social History Social History  Substance Use Topics  . Smoking status: Current Every Day Smoker    Packs/day: 0.50    Types: Cigarettes  . Smokeless tobacco: Never Used  . Alcohol use No     Allergies   Review of patient's allergies indicates no known allergies.   Review of Systems Review of Systems All other systems negative unless otherwise stated in HPI   Physical Exam Updated Vital Signs BP 170/71   Pulse (!) 37   Temp 98.3 F (36.8 C) (Oral)   Resp 16   Ht 5\' 8"  (1.727 m)   Wt 65.8 kg   SpO2 100%   BMI 22.05 kg/m   Physical Exam  Constitutional: He is oriented to person, place, and time. He appears well-developed and well-nourished.  Non-toxic appearance. He does not have a sickly appearance. He does not appear ill.  HENT:  Head: Normocephalic and atraumatic.  Mouth/Throat: Oropharynx is clear and moist.  Eyes: Conjunctivae are normal. Pupils are equal, round, and reactive to light.  Neck: Normal range of motion. Neck supple.  Cardiovascular: Normal rate and regular rhythm.   Pulmonary/Chest: Effort  normal and breath sounds normal. No accessory muscle usage or stridor. No respiratory distress. He has no wheezes. He has no rhonchi. He has no rales.  Abdominal: Soft. Bowel sounds are normal. He exhibits no distension and no mass. There is no tenderness. There is no rebound.  Genitourinary: Prostate normal. Rectal exam shows tenderness. Rectal exam shows no external hemorrhoid, no fissure, anal tone normal and guaiac negative stool. Prostate is not tender.  Genitourinary Comments: Chaperone present.  DRE reveals minimal soft brown stool in the rectal vault.  No external hemorrhoids.  No fissures. External palpation exquisitely tender notably to left buttock with mild erythema without fluctuance.    Musculoskeletal: Normal range of motion.  Lymphadenopathy:    He has no cervical adenopathy.  Neurological: He is alert and oriented to person, place, and time.  Speech clear without dysarthria.  Skin: Skin is warm and dry.  Psychiatric: He has a normal mood and affect. His behavior is normal.     ED Treatments / Results  Labs (all labs ordered are listed, but only abnormal results are displayed) Labs Reviewed  CBC WITH DIFFERENTIAL/PLATELET - Abnormal; Notable for the following:       Result Value   Neutro Abs 8.5 (*)    All other components within normal limits  BASIC METABOLIC PANEL - Abnormal; Notable for the following:    Sodium 134 (*)    Chloride 100 (*)    Glucose, Bld 100 (*)    All other components within normal limits  URINALYSIS, ROUTINE W REFLEX MICROSCOPIC (NOT AT [facility]) - Abnormal; Notable for the following:    Hgb urine dipstick LARGE (*)    Protein, ur 100 (*)    Nitrite POSITIVE (*)    Leukocytes, UA SMALL (*)    All other components within normal limits  URINE MICROSCOPIC-ADD ON - Abnormal; Notable for the following:    Squamous Epithelial / LPF 0-5 (*)    Bacteria, UA MANY (*)    All other components within normal limits  URINE CULTURE  POC OCCULT BLOOD, ED    EKG  EKG Interpretation None       Radiology Ct Pelvis W Contrast  Result Date: 08/03/2016 CLINICAL DATA:  Severe anal pain for 4 days. EXAM: CT PELVIS WITH CONTRAST TECHNIQUE: Multidetector CT imaging of the pelvis was performed using the standard protocol following the bolus administration of intravenous contrast. CONTRAST:  75 cc ISOVUE-300 IOPAMIDOL (ISOVUE-300) INJECTION 61% COMPARISON:  None. FINDINGS: Urinary Tract:  No abnormality visualized. Bowel: Tubular collection of complex fluid and air extending in serpiginous fashion from the anal verge, mostly to the left of midline, consistent with anal fistula/abscess with interposed areas of soft tissue thickening. The abscess component  measures up to 1.2 cm thickness, with overall course measuring approximately 5.7 cm AP dimension and with extension to the left of midline measuring approximately 3.5 cm. The intrapelvic portion of the bowel appears nonobstructive. Vascular/Lymphatic: No pathologically enlarged lymph nodes. No significant vascular abnormality seen. Prominent atherosclerotic changes noted along the course of the the iliac arteries bilaterally. Reproductive:  No mass or other significant abnormality Other: No intrapelvic abscess collection extension or free fluid. No free intraperitoneal air seen. Musculoskeletal: No acute or suspicious osseous lesions identified. IMPRESSION: 1. Tubular perianal fistula/abscess collection, serpiginous in configuration, located mostly to the left of midline, abscess component measuring up to 1.2 cm thickness (measured on series 3, image 42). Overall, the combination of fistula/abscess and interposed areas of soft tissue thickening measure  5.7 x 3.5 cm (measured on series 3, images 41 and 43). 2. No intrapelvic extension. No evidence of associated bowel obstruction. Electronically Signed   By: Bary Richard M.D.   On: 08/03/2016 12:21    Procedures Procedures (including critical care time)  Medications Ordered in ED Medications  cefTRIAXone (ROCEPHIN) 1 g in dextrose 5 % 50 mL IVPB (not administered)  lisinopril (PRINIVIL,ZESTRIL) tablet 40 mg (40 mg Oral Given 08/03/16 0858)  amLODipine (NORVASC) tablet 10 mg (10 mg Oral Given 08/03/16 0858)  metoprolol tartrate (LOPRESSOR) tablet 25 mg (25 mg Oral Given 08/03/16 0858)  morphine 2 MG/ML injection 2 mg (2 mg Intravenous Given 08/03/16 0912)  mineral oil enema 1 enema (1 enema Rectal Given 08/03/16 1213)  iopamidol (ISOVUE-300) 61 % injection (75 mLs Intravenous Contrast Given 08/03/16 1141)  clindamycin (CLEOCIN) IVPB 600 mg (600 mg Intravenous New Bag/Given 08/03/16 1259)     Initial Impression / Assessment and Plan / ED Course  I have  reviewed the triage vital signs and the nursing notes.  Pertinent labs & imaging results that were available during my care of the patient were reviewed by me and considered in my medical decision making (see chart for details).  Clinical Course   Patient presents with constipation x 4 days.  Complains of severe rectal pain.  No fevers, N/V/D, bloody stools, abdominal pain, or urinary symptoms.  Patient appears well, non-toxic.  He is hypertensive, known hx of HTN, been out of medications.  Will give dose here and refill script.  Afebrile.  Abdomen soft and benign.  DRE revealed minimal soft brown stool in rectal vault without impaction.  No hemorrhoids or fissures visualized.  Exquisitely tender. Consider obstruction, but less likely given lack of N/V and abdominal pain. Fecal occult negative.  Consider prostatitis, but less likely with no prostate tenderness on exam.  DDx includes anal fistula or abscess.  Will check labs, UA, and give enema and reassess.  If no improvement, will consider CT pelvis w/ contrast for further evaluation.   UA appears infections, culture sent.  Will order Rocephin.  No leukocytosis.  No metabolic derangements. CT shows perianal fistula/abscess collection.  Started on IV Clindamycin and spoke with general surgery who will evaluate the patient. Patient admitted by general surgery for I&D.   Case has been discussed with and seen by Dr. Clayborne Dana who agrees with the above plan for admission.  Final Clinical Impressions(s) / ED Diagnoses   Final diagnoses:  Perianal fistula  Perianal abscess    New Prescriptions New Prescriptions   No medications on file     Gwinda Maine 08/03/16 1402    Marily Memos, MD 08/03/16 1649

## 2016-08-03 NOTE — ED Provider Notes (Signed)
Medical screening examination/treatment/procedure(s) were conducted as a shared visit with non-physician practitioner(s) and myself.  I personally evaluated the patient during the encounter.  70 yo M here with left sided gluteal cleft pain, ttp, erythema, warmth. Fluctuant area in the middle. Pain with defecation.  My exam c/w same.  Concerning for abscess v fistula v cellulitis. Plan for abx, ct scan to eval extent and dispo accordingly.    Marily MemosJason Shamekia Tippets, MD 08/03/16 [phone]

## 2016-08-03 NOTE — ED Triage Notes (Signed)
Patient states he had some sort of BM 4 days ago but has not been able to really have one since.  States he knows he has hard stool there but cannot get it out.  Also c/o pain to right inner leg at the groin area

## 2016-08-04 ENCOUNTER — Encounter (HOSPITAL_COMMUNITY): Payer: Self-pay | Admitting: General Surgery

## 2016-08-04 MED ORDER — OXYCODONE HCL 5 MG PO TABS: 5.0000 mg | ORAL_TABLET | ORAL | 0 refills | Status: AC | PRN

## 2016-08-04 NOTE — Progress Notes (Signed)
Pt discharge. Discharge information given with prescription and Sitz bath as well. Says he understands

## 2016-08-04 NOTE — Discharge Summary (Signed)
[facility] Discharge Summary   Patient ID: Duane GougeMarvin L Kocourek MRN: [id number] DOB/AGE: 69/11/1945 69 y.o.  Admit date: 08/03/2016 Discharge date: 08/04/2016  Admitting Diagnosis: Perianal fistula  Discharge Diagnosis Patient Active Problem List   Diagnosis Date Noted  . Perianal abscess 08/03/2016  . Perirectal abscess 08/03/2016    Consultants None  Imaging: Ct Pelvis W Contrast  Result Date: 08/03/2016 CLINICAL DATA:  Severe anal pain for 4 days. EXAM: CT PELVIS WITH CONTRAST TECHNIQUE: Multidetector CT imaging of the pelvis was performed using the standard protocol following the bolus administration of intravenous contrast. CONTRAST:  75 cc ISOVUE-300 IOPAMIDOL (ISOVUE-300) INJECTION 61% COMPARISON:  None. FINDINGS: Urinary Tract:  No abnormality visualized. Bowel: Tubular collection of complex fluid and air extending in serpiginous fashion from the anal verge, mostly to the left of midline, consistent with anal fistula/abscess with interposed areas of soft tissue thickening. The abscess component measures up to 1.2 cm thickness, with overall course measuring approximately 5.7 cm AP dimension and with extension to the left of midline measuring approximately 3.5 cm. The intrapelvic portion of the bowel appears nonobstructive. Vascular/Lymphatic: No pathologically enlarged lymph nodes. No significant vascular abnormality seen. Prominent atherosclerotic changes noted along the course of the the iliac arteries bilaterally. Reproductive:  No mass or other significant abnormality Other: No intrapelvic abscess collection extension or free fluid. No free intraperitoneal air seen. Musculoskeletal: No acute or suspicious osseous lesions identified. IMPRESSION: 1. Tubular perianal fistula/abscess collection, serpiginous in configuration, located mostly to the left of midline, abscess component measuring up to 1.2 cm thickness (measured on series 3, image 42). Overall, the combination of  fistula/abscess and interposed areas of soft tissue thickening measure 5.7 x 3.5 cm (measured on series 3, images 41 and 43). 2. No intrapelvic extension. No evidence of associated bowel obstruction. Electronically Signed   By: Bary RichardStan  Maynard M.D.   On: 08/03/2016 12:21    Procedures Dr. Donell BeersByerly (08/04/16) - Incision and drainage left complex perirectal abscess, placement of draining seton  Hospital Course:  Duane GougeMarvin L Long is a 70yo male who [facility] with 4 days of rectal pain.  Workup included a CT scan which showed tubular perianal fistula/abscess collection. He also had a UTI and received a single dose of rocephin.  Patient was admitted and underwent procedure listed above.  Tolerated procedure well and was transferred to the floor.  On POD1 the patient was tolerating diet, ambulating well, pain well controlled, vital signs stable, incisions c/d/i and felt stable for discharge home.  Packing was removed from the wound, seton remains in place. Patient will perform twice daily sitz baths and follow-up with Dr. Donell BeersByerly in about 2 weeks. He will not need any antibiotics after discharge.    Physical Exam: Gen:  Alert, NAD, pleasant Abd: Soft, NT/ND, +BS Perirectal abscess s/p I&D and seton placement, packing in place with no drainage    Medication List    STOP taking these medications   cephALEXin 500 MG capsule Commonly known as:  KEFLEX     TAKE these medications   alfuzosin 10 MG 24 hr tablet Commonly known as:  UROXATRAL Take 10 mg by mouth daily with breakfast.   amLODipine 10 MG tablet Commonly known as:  NORVASC Take 5 mg by mouth daily.   cetirizine 10 MG tablet Commonly known as:  ZYRTEC Take 10 mg by mouth daily.   ibuprofen 200 MG tablet Commonly known as:  ADVIL,MOTRIN Take 400 mg by mouth every 6 (six) hours  as needed for moderate pain.   lisinopril 40 MG tablet Commonly known as:  PRINIVIL,ZESTRIL Take 40 mg by mouth daily.   metoprolol tartrate 25  MG tablet Commonly known as:  LOPRESSOR Take 25 mg by mouth 2 (two) times daily.   oxyCODONE 5 MG immediate release tablet Commonly known as:  Oxy IR/ROXICODONE Take 1-2 tablets (5-10 mg total) by mouth every 4 (four) hours as needed for moderate pain.        Follow-up Information    BYERLY,FAERA, MD. Call today.   Specialty:  General Surgery Why:  Please call office as soon as you leave the hospital to make a follow-up appointment with Dr. Donell Beers in 2 weeks Contact information: [address] [place] [address] [phone]           Signed: Edson Snowball, [facility] 08/04/2016, 4:13 PM Pager: [phone] Consults: [phone] Mon-Fri 7:00 am-4:30 pm Sat-Sun 7:00 am-11:30 am

## 2016-08-04 NOTE — Discharge Instructions (Signed)
How to Take a Sitz Bath  A sitz bath is a warm water bath that is taken while you are sitting down. The water should only come up to your hips and should cover your buttocks. Your health care provider may recommend a sitz bath to help you:   · Clean the lower part of your body, including your genital area.  · With itching.  · With pain.  · With sore muscles or muscles that tighten or spasm.  HOW TO TAKE A SITZ BATH  Take 3-4 sitz baths per day or as told by your health care provider.  1. Partially fill a bathtub with warm water. You will only need the water to be deep enough to cover your hips and buttocks when you are sitting in it.  2. If your health care provider told you to put medicine in the water, follow the directions exactly.  3. Sit in the water and open the tub drain a little.  4. Turn on the warm water again to keep the tub at the correct level. Keep the water running constantly.  5. Soak in the water for 15-20 minutes or as told by your health care provider.  6. After the sitz bath, pat the affected area dry first. Do not rub it.  7. Be careful when you stand up after the sitz bath because you may feel dizzy.  SEEK MEDICAL CARE IF:  · Your symptoms get worse. Do not continue with sitz baths if your symptoms get worse.  · You have new symptoms. Do not continue with sitz baths until you talk with your health care provider.     This information is not intended to replace advice given to you by your health care provider. Make sure you discuss any questions you have with your health care provider.     Document Released: 07/18/2004 Document Revised: 03/12/2015 Document Reviewed: 10/24/2014  Elsevier Interactive Patient Education ©2016 [employer]

## 2016-08-04 NOTE — Progress Notes (Signed)
Patient ID: Duane Long, male   DOB: 12-Mar-1946, 70 y.o.   MRN: [id number]  [facility] Surgery Progress Note  1 Day Post-Op  Subjective: Doing well. Pain controlled. Tolerating diet and passing gas. Asking when he gets to go home.  Objective: Vital signs in last 24 hours: Temp:  [98.3 F (36.8 C)-101.2 F (38.4 C)] 98.5 F (36.9 C) (09/26 1036) Pulse Rate:  [37-90] 76 (09/26 1036) Resp:  [11-20] 18 (09/26 0522) BP: (119-203)/(62-102) 119/69 (09/26 1036) SpO2:  [98 %-100 %] 99 % (09/26 1036) Last BM Date: 08/03/16  Intake/Output from previous day: 09/25 0701 - 09/26 0700 In: 2070 [P.O.:120; I.V.:1850; IV Piggyback:100] Out: 2050 [Urine:2050] Intake/Output this shift: No intake/output data recorded.  PE: Gen:  Alert, NAD, pleasant Abd: Soft, NT/ND, +BS Perirectal abscess s/p I&D and seton placement, packing in place with no drainage  Lab Results:   Recent Labs  08/03/16 0910  WBC 10.1  HGB 13.1  HCT 39.7  PLT 221   BMET  Recent Labs  08/03/16 0910  NA 134*  K 3.5  CL 100*  CO2 24  GLUCOSE 100*  BUN 11  CREATININE 1.05  CALCIUM 9.1   PT/INR No results for input(s): LABPROT, INR in the last 72 hours. CMP     Component Value Date/Time   NA 134 (L) 08/03/2016 0910   K 3.5 08/03/2016 0910   CL 100 (L) 08/03/2016 0910   CO2 24 08/03/2016 0910   GLUCOSE 100 (H) 08/03/2016 0910   BUN 11 08/03/2016 0910   CREATININE 1.05 08/03/2016 0910   CALCIUM 9.1 08/03/2016 0910   GFRNONAA >60 08/03/2016 0910   GFRAA >60 08/03/2016 0910   Lipase  No results found for: LIPASE     Studies/Results: Ct Pelvis W Contrast  Result Date: 08/03/2016 CLINICAL DATA:  Severe anal pain for 4 days. EXAM: CT PELVIS WITH CONTRAST TECHNIQUE: Multidetector CT imaging of the pelvis was performed using the standard protocol following the bolus administration of intravenous contrast. CONTRAST:  75 cc ISOVUE-300 IOPAMIDOL (ISOVUE-300) INJECTION 61% COMPARISON:  None.  FINDINGS: Urinary Tract:  No abnormality visualized. Bowel: Tubular collection of complex fluid and air extending in serpiginous fashion from the anal verge, mostly to the left of midline, consistent with anal fistula/abscess with interposed areas of soft tissue thickening. The abscess component measures up to 1.2 cm thickness, with overall course measuring approximately 5.7 cm AP dimension and with extension to the left of midline measuring approximately 3.5 cm. The intrapelvic portion of the bowel appears nonobstructive. Vascular/Lymphatic: No pathologically enlarged lymph nodes. No significant vascular abnormality seen. Prominent atherosclerotic changes noted along the course of the the iliac arteries bilaterally. Reproductive:  No mass or other significant abnormality Other: No intrapelvic abscess collection extension or free fluid. No free intraperitoneal air seen. Musculoskeletal: No acute or suspicious osseous lesions identified. IMPRESSION: 1. Tubular perianal fistula/abscess collection, serpiginous in configuration, located mostly to the left of midline, abscess component measuring up to 1.2 cm thickness (measured on series 3, image 42). Overall, the combination of fistula/abscess and interposed areas of soft tissue thickening measure 5.7 x 3.5 cm (measured on series 3, images 41 and 43). 2. No intrapelvic extension. No evidence of associated bowel obstruction. Electronically Signed   By: Bary Richard M.D.   On: 08/03/2016 12:21    Anti-infectives: Anti-infectives    Start     Dose/Rate Route Frequency Ordered Stop   08/03/16 2200  cefoTEtan (CEFOTAN) 2 g in dextrose 5 % 50  mL IVPB     2 g 100 mL/hr over 30 Minutes Intravenous Every 12 hours 08/03/16 1901 08/03/16 2233   08/03/16 1430  piperacillin-tazobactam (ZOSYN) IVPB 3.375 g  Status:  Discontinued     3.375 g 12.5 mL/hr over 240 Minutes Intravenous Every 6 hours 08/03/16 1418 08/03/16 1901   08/03/16 1400  cefTRIAXone (ROCEPHIN) 1 g in  dextrose 5 % 50 mL IVPB     1 g 100 mL/hr over 30 Minutes Intravenous  Once 08/03/16 1358     08/03/16 1245  clindamycin (CLEOCIN) IVPB 600 mg     600 mg 100 mL/hr over 30 Minutes Intravenous  Once 08/03/16 1232 08/03/16 1430       Assessment/Plan S/p incision and drainage left complex preirectal abscess, placement draining seton 08/03/16 Dr. Donell BeersByerly - POD1 - packing removed, seton remains in place  Urinary tract infection - UA - positive for leukocytes and nitrites - urine culture pending - has been on clinda, zosyn, and rocephin while admitted  Hypertension - home medications PUD - pepcid Urinary retention - continue alfuzosin  ID - single dose clinda 08/03/16, zosyn 9/25>>9/25, cefotan periop 9/25, rocephin 9/25>>9/25 FEN - regular VTE - lovenox (patient refused)  Plan: Ready for discharge. Packing removed, seton remains until follow-up. No antibiotics needed at discharge. Will follow-up with Dr. Donell BeersByerly in 2 weeks.   LOS: 0 days    Edson SnowballBROOKE A MILLER , [facility] 08/04/2016, 11:51 AM Pager: [phone] Consults: [phone] Mon-Fri 7:00 am-4:30 pm Sat-Sun 7:00 am-11:30 am

## 2016-08-05 NOTE — Anesthesia Postprocedure Evaluation (Signed)
Anesthesia Post Note  Patient: Duane Long  Procedure(s) Performed: Procedure(s) (LRB): IRRIGATION AND DEBRIDEMENT PERIRECTAL ABSCESS (N/A)  Patient location during evaluation: PACU Anesthesia Type: General Level of consciousness: awake and alert Pain management: pain level controlled Vital Signs Assessment: post-procedure vital signs reviewed and stable Respiratory status: spontaneous breathing, nonlabored ventilation and respiratory function stable Cardiovascular status: blood pressure returned to baseline and stable Postop Assessment: no signs of nausea or vomiting Anesthetic complications: no     Last Vitals:  Vitals:   08/04/16 0522 08/04/16 1036  BP: 137/64 119/69  Pulse: 71 76  Resp: 18   Temp: 36.8 C 36.9 C    Last Pain:  Vitals:   08/04/16 1036  TempSrc: Oral  PainSc:    Pain Goal:                 Kristyn Obyrne A

## 2016-08-06 LAB — URINE CULTURE

## 2016-08-08 LAB — AEROBIC/ANAEROBIC CULTURE (SURGICAL/DEEP WOUND)

## 2016-08-08 LAB — AEROBIC/ANAEROBIC CULTURE W GRAM STAIN (SURGICAL/DEEP WOUND)

## 2017-04-30 ENCOUNTER — Ambulatory Visit
Admission: RE | Admit: 2017-04-30 | Discharge: 2017-04-30 | Disposition: A | Discharge status: Home / Self Care | Payer: Medicare Other | Source: Ambulatory Visit | Attending: Internal Medicine | Admitting: Internal Medicine

## 2017-04-30 ENCOUNTER — Other Ambulatory Visit: Payer: Self-pay | Admitting: Internal Medicine

## 2017-04-30 DIAGNOSIS — R634 Abnormal weight loss: Secondary | ICD-10-CM

## 2017-09-27 IMAGING — CT CT PELVIS W/ CM
2 of 4 series · 16 of 46 positions shown, 18 images · IV contrast (iopamidol)
Comparison: None.

CLINICAL DATA: Severe anal pain for 4 days.

EXAM:
CT PELVIS WITH CONTRAST
TECHNIQUE: Multidetector CT imaging of the pelvis was performed using the
standard protocol following the bolus administration of intravenous
contrast.
CONTRAST:  75 cc [id number] IOPAMIDOL ([id number]) INJECTION 61%

[Series 5: thins · axial · 0.73mm/px · z∈[+818,+1038]mm · 13 of 202 slices shown, 15 images]
[im 9/202  soft-tissue]
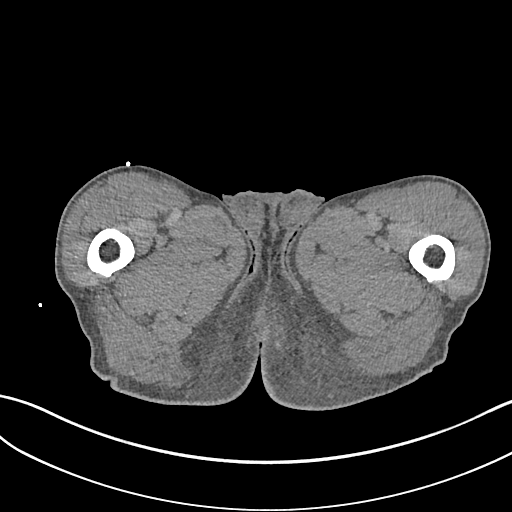
[im 9/202  bone]
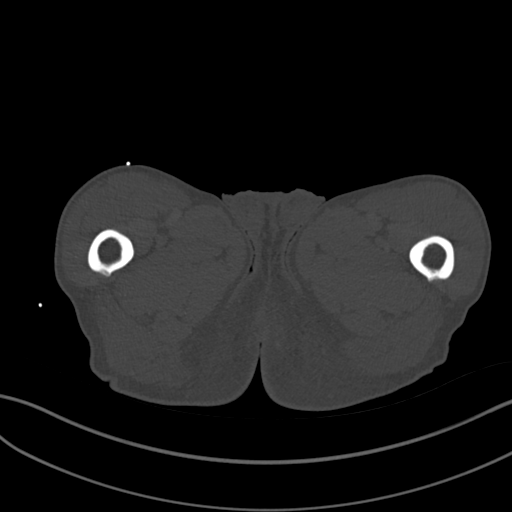
[im 25/202  soft-tissue]
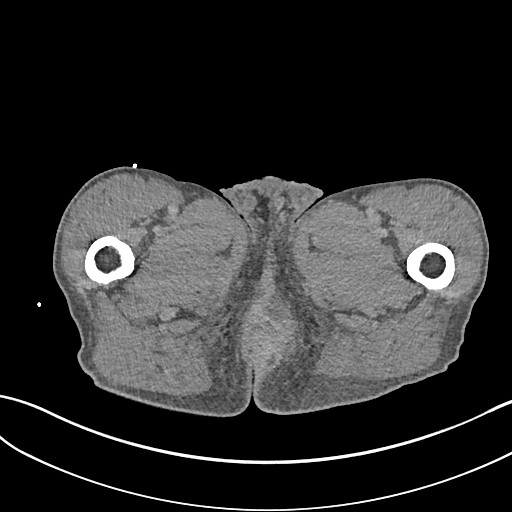
[im 41/202  soft-tissue]
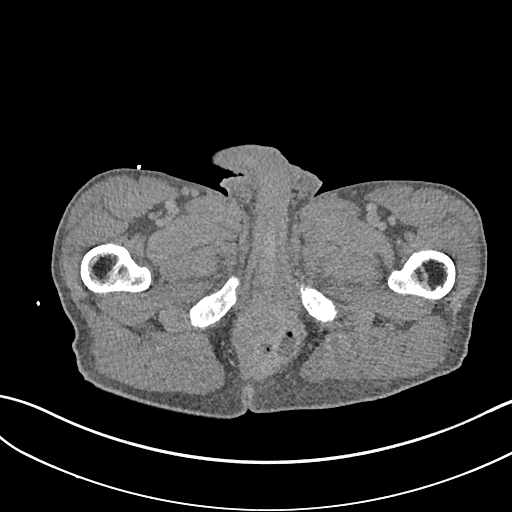
[im 57/202  soft-tissue]
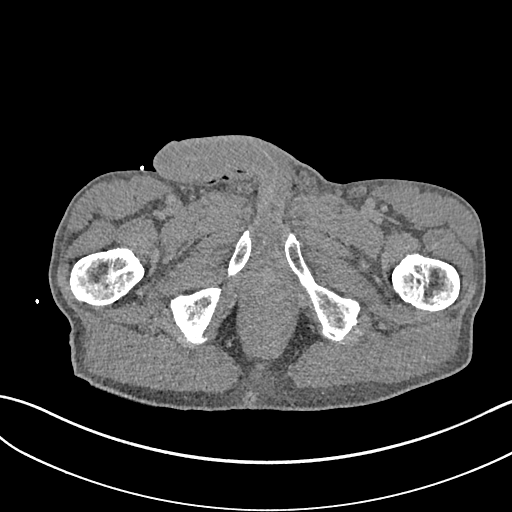
[im 73/202  soft-tissue]
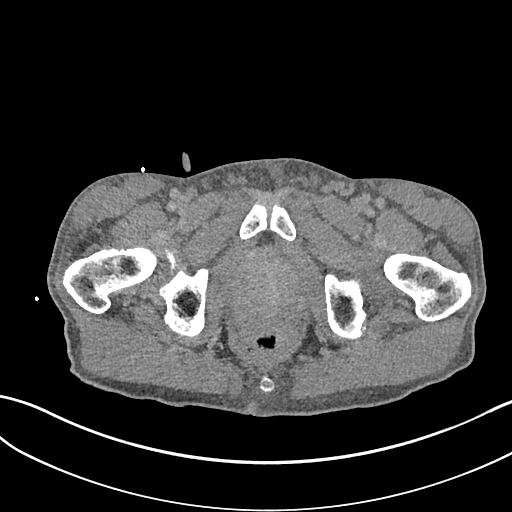
[im 89/202  soft-tissue]
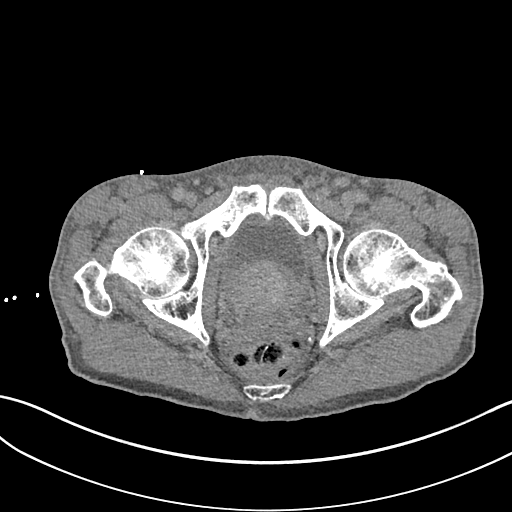
[im 105/202  soft-tissue]
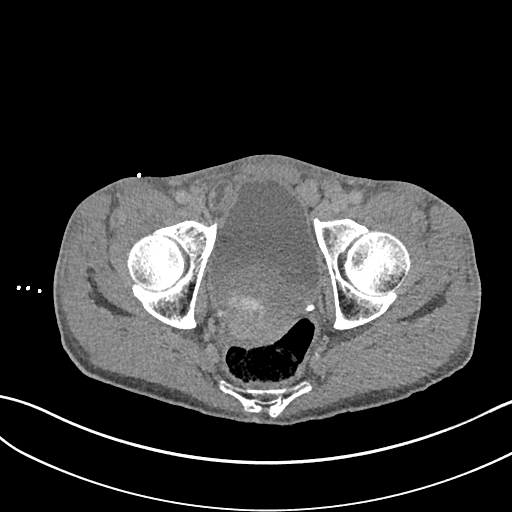
[im 113/202  soft-tissue]
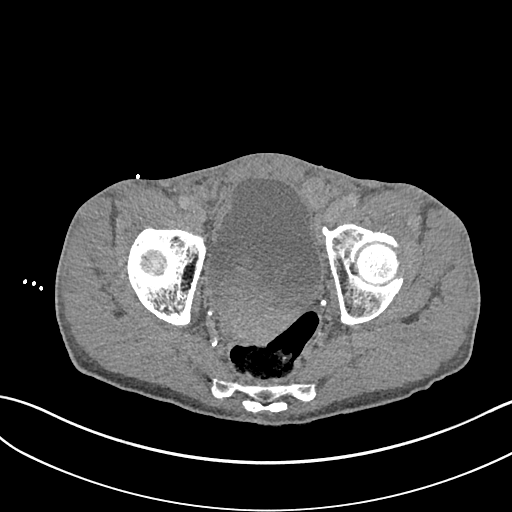
[im 129/202  soft-tissue]
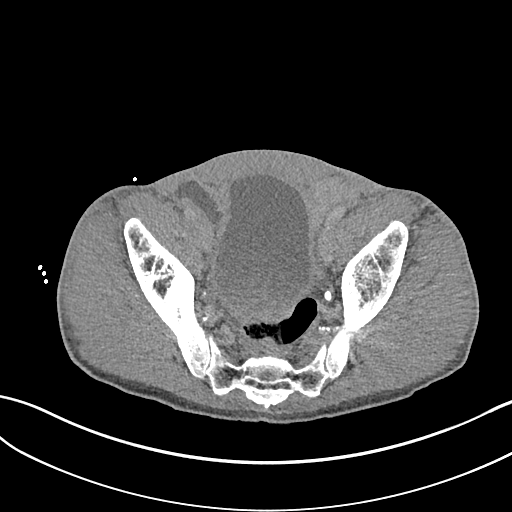
[im 129/202  bone]
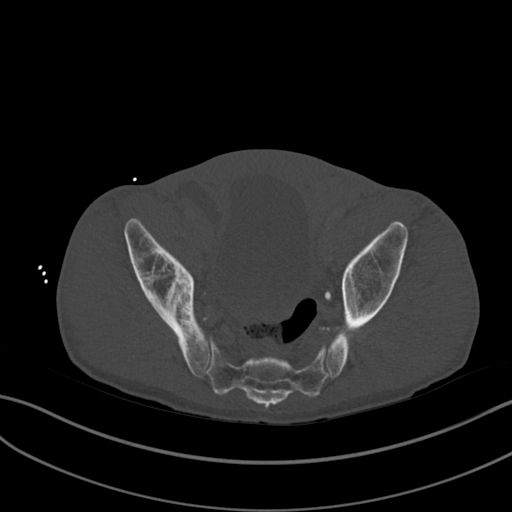
[im 145/202  soft-tissue]
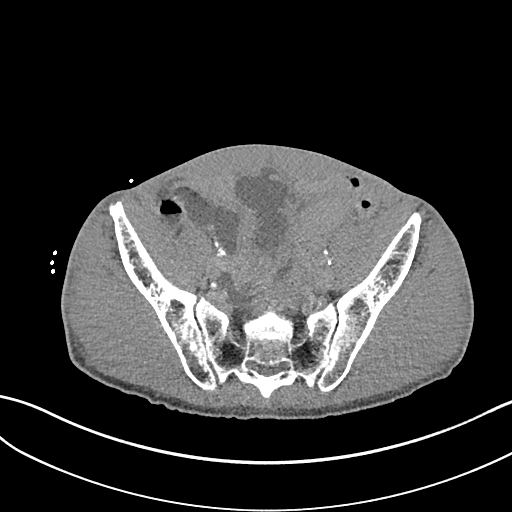
[im 161/202  soft-tissue]
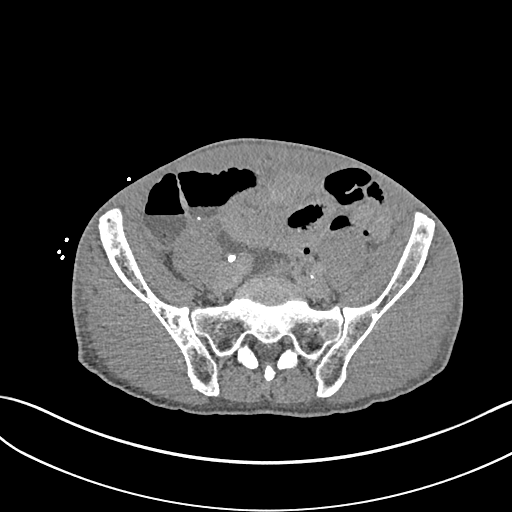
[im 177/202  soft-tissue]
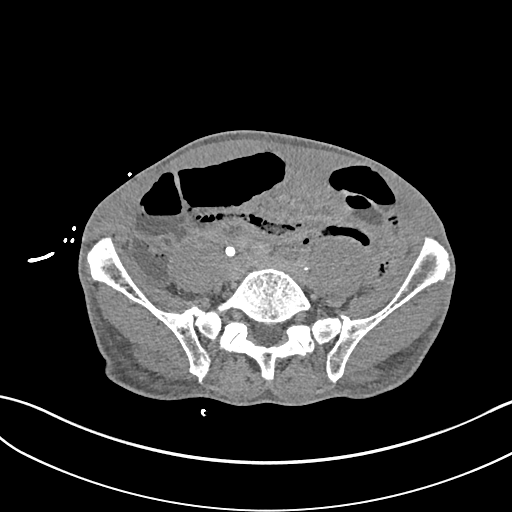
[im 193/202  soft-tissue]
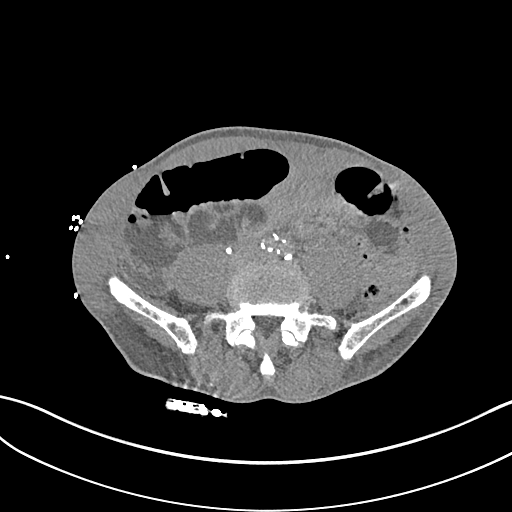

[Series 8: coronal · coronal · 0.56mm/px · 3 of 122 slices shown]
[im 41/122  soft-tissue]
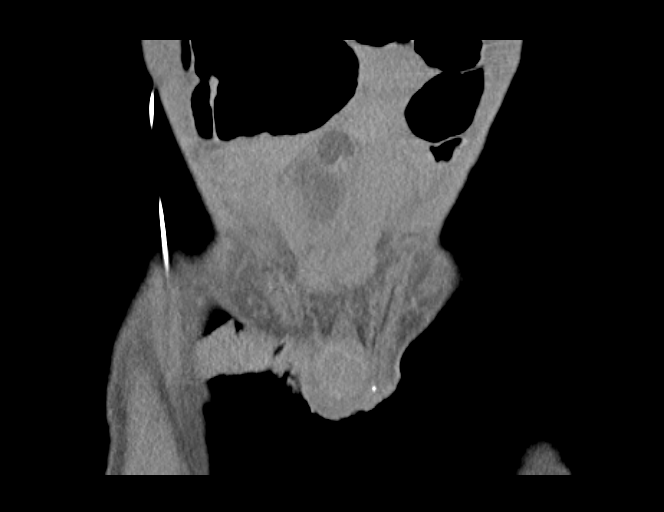
[im 54/122  soft-tissue]
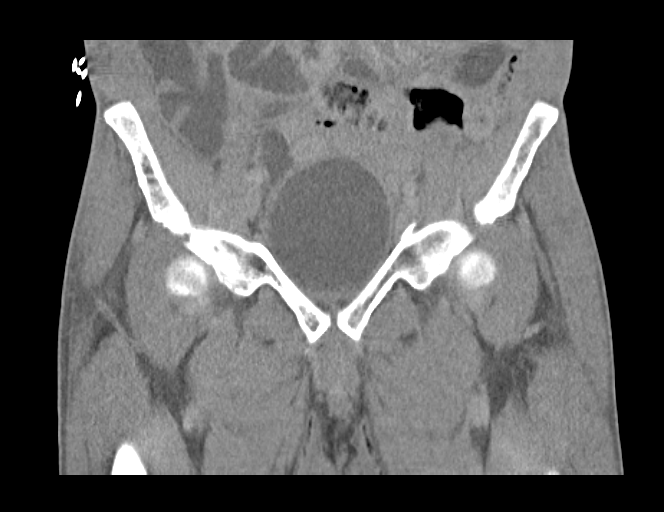
[im 68/122  soft-tissue]
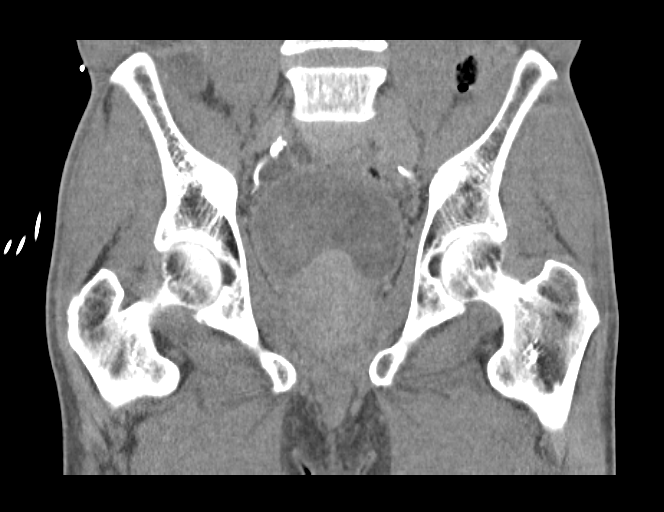

[16 of 46 positions shown; findings below may reference images not displayed]

FINDINGS: Urinary Tract:  No abnormality visualized.

Bowel: Tubular collection of complex fluid and air extending in
serpiginous fashion from the anal verge, mostly to the left of
midline, consistent with anal fistula/abscess with interposed areas
of soft tissue thickening. The abscess component measures up to
cm thickness, with overall course measuring approximately 5.7 cm AP
dimension and with extension to the left of midline measuring
approximately 3.5 cm.

The intrapelvic portion of the bowel appears nonobstructive.

Vascular/Lymphatic: No pathologically enlarged lymph nodes. No
significant vascular abnormality seen. Prominent atherosclerotic
changes noted along the course of the the iliac arteries
bilaterally.

Reproductive:  No mass or other significant abnormality

Other: No intrapelvic abscess collection extension or free fluid. No
free intraperitoneal air seen.

Musculoskeletal: No acute or suspicious osseous lesions identified.
IMPRESSION: 1. Tubular perianal fistula/abscess collection, serpiginous in
configuration, located mostly to the left of midline, abscess
component measuring up to 1.2 cm thickness (measured on series 3,
image 42). Overall, the combination of fistula/abscess and
interposed areas of soft tissue thickening measure 5.7 x 3.5 cm
(measured on series 3, images 41 and 43).
2. No intrapelvic extension. No evidence of associated bowel
obstruction.

## 2017-12-03 ENCOUNTER — Emergency Department (HOSPITAL_COMMUNITY)
Admission: EM | Admit: 2017-12-03 | Discharge: 2017-12-03 | Disposition: A | Discharge status: Home / Self Care | Payer: Medicare Other | Attending: Emergency Medicine | Admitting: Emergency Medicine

## 2017-12-03 ENCOUNTER — Encounter (HOSPITAL_COMMUNITY): Payer: Self-pay | Admitting: Emergency Medicine

## 2017-12-03 ENCOUNTER — Other Ambulatory Visit: Payer: Self-pay

## 2017-12-03 ENCOUNTER — Emergency Department (HOSPITAL_COMMUNITY): Payer: Medicare Other

## 2017-12-03 DIAGNOSIS — Z79899 Other long term (current) drug therapy: Secondary | ICD-10-CM | POA: Insufficient documentation

## 2017-12-03 DIAGNOSIS — Z87891 Personal history of nicotine dependence: Secondary | ICD-10-CM | POA: Insufficient documentation

## 2017-12-03 DIAGNOSIS — I10 Essential (primary) hypertension: Secondary | ICD-10-CM | POA: Diagnosis present

## 2017-12-03 LAB — CBC
HCT: 39.8 % (ref 39.0–52.0)
Hemoglobin: 13.4 g/dL (ref 13.0–17.0)
MCH: 28.9 pg (ref 26.0–34.0)
MCHC: 33.7 g/dL (ref 30.0–36.0)
MCV: 85.8 fL (ref 78.0–100.0)
Platelets: 225 10*3/uL (ref 150–400)
RBC: 4.64 MIL/uL (ref 4.22–5.81)
RDW: 15.5 % (ref 11.5–15.5)
WBC: 4.4 10*3/uL (ref 4.0–10.5)

## 2017-12-03 LAB — BASIC METABOLIC PANEL
Anion gap: 12 (ref 5–15)
BUN: 16 mg/dL (ref 6–20)
CO2: 21 mmol/L — ABNORMAL LOW (ref 22–32)
Calcium: 9.1 mg/dL (ref 8.9–10.3)
Chloride: 103 mmol/L (ref 101–111)
Creatinine, Ser: 1.13 mg/dL (ref 0.61–1.24)
GFR calc Af Amer: >60 mL/min (ref >60)
GFR calc non Af Amer: >60 mL/min (ref >60)
Glucose, Bld: 100 mg/dL — ABNORMAL HIGH (ref 65–99)
Potassium: 4.3 mmol/L (ref 3.5–5.1)
Sodium: 136 mmol/L (ref 135–145)

## 2017-12-03 LAB — I-STAT TROPONIN, ED: Troponin i, poc: 0.01 ng/mL (ref 0.00–0.08)

## 2017-12-03 MED ORDER — LISINOPRIL 10 MG PO TABS: 10.0000 mg | ORAL_TABLET | Freq: Every day | ORAL | 0 refills | Status: AC

## 2017-12-03 NOTE — ED Provider Notes (Signed)
[facility] EMERGENCY [facility]PARTMENT Provider Note   CSN: [id number] Arrival date & time: 12/03/17  1350     History   Chief Complaint Chief Complaint  Patient presents with  . Chest Pain  . Hypertension    HPI Duane GougeMarvin L Long is a 72 y.o. male.  HPI   72 year old male with hypertension.  Patient states that his blood pressures been running 160s to over 200 systolic for the last few days.  His medication list shows metoprolol, lisinopril and amlodipine although he says he is currently only taking 10 mg of amlodipine.  On review of systems, he does endorse some chest pain.  Describes some substernal pressure.  He typically notices this when he is laying down at night.  Does not notice it during the day.  No change with exertion.  Denies any associated dyspnea, nausea or diaphoresis.  Past Medical History:  Diagnosis Date  . Hypertension   . Urinary retention     Patient Active Problem List   Diagnosis Date Noted  . Perianal abscess 08/03/2016  . Perirectal abscess 08/03/2016    Past Surgical History:  Procedure Laterality Date  . INCISION AND DRAINAGE PERIRECTAL ABSCESS N/A 08/03/2016   Procedure: IRRIGATION AND DEBRIDEMENT PERIRECTAL ABSCESS;  Surgeon: Almond LintFaera Byerly, MD;  Location: [facility] OR;  Service: General;  Laterality: N/A;  . peptic ulcer         Home Medications    Prior to Admission medications   Medication Sig Start Date End Date Taking? Authorizing Provider  alfuzosin (UROXATRAL) 10 MG 24 hr tablet Take 10 mg by mouth daily with breakfast.    [provider]  amLODipine (NORVASC) 10 MG tablet Take 5 mg by mouth daily.    [provider]  cetirizine (ZYRTEC) 10 MG tablet Take 10 mg by mouth daily.    [provider]  ibuprofen (ADVIL,MOTRIN) 200 MG tablet Take 400 mg by mouth every 6 (six) hours as needed for moderate pain.    [provider]  lisinopril (PRINIVIL,ZESTRIL) 40 MG tablet Take 40 mg by mouth  daily.    [provider]  metoprolol tartrate (LOPRESSOR) 25 MG tablet Take 25 mg by mouth 2 (two) times daily.    [provider]  oxyCODONE (OXY IR/ROXICODONE) 5 MG immediate release tablet Take 1-2 tablets (5-10 mg total) by mouth every 4 (four) hours as needed for moderate pain. 08/04/16   Meuth, Lina SarBrooke A, PA-C    Family History No family history on file.  Social History Social History   Tobacco Use  . Smoking status: Former Smoker    Packs/day: 0.50    Last attempt to quit: 09/02/2017    Years since quitting: 0.2  . Smokeless tobacco: Never Used  Substance Use Topics  . Alcohol use: No  . Drug use: Yes    Types: Marijuana     Allergies   Patient has no known allergies.   Review of Systems Review of Systems  All systems reviewed and negative, other than as noted in HPI.  Physical Exam Updated Vital Signs BP (!) 177/79 (BP Location: Right Arm)   Pulse 70   Temp 98.4 F (36.9 C) (Oral)   Resp 16   Ht 5\' 8"  (1.727 m)   Wt 63.5 kg (140 lb)   SpO2 100%   BMI 21.29 kg/m   Physical Exam  Constitutional: He appears well-developed and well-nourished. No distress.  HENT:  Head: Normocephalic and atraumatic.  Eyes: Conjunctivae are normal. Right  eye exhibits no discharge. Left eye exhibits no discharge.  Neck: Neck supple.  Cardiovascular: Normal rate, regular rhythm and normal heart sounds. Exam reveals no gallop and no friction rub.  No murmur heard. Pulmonary/Chest: Effort normal and breath sounds normal. No respiratory distress.  Abdominal: Soft. He exhibits no distension. There is no tenderness.  Musculoskeletal: He exhibits no edema or tenderness.  Neurological: He is alert.  Skin: Skin is warm and dry.  Psychiatric: He has a normal mood and affect. His behavior is normal. Thought content normal.  Nursing note and vitals reviewed.    ED Treatments / Results  Labs (all labs ordered are listed, but only abnormal results are  displayed) Labs Reviewed  BASIC METABOLIC PANEL - Abnormal; Notable for the following components:      Result Value   CO2 21 (*)    Glucose, Bld 100 (*)    All other components within normal limits  CBC  I-STAT TROPONIN, ED    EKG  EKG Interpretation  Date/Time:  Friday December 03 2017 14:03:04 EST Ventricular Rate:  77 PR Interval:  154 QRS Duration: 88 QT Interval:  372 QTC Calculation: 420 R Axis:   44 Text Interpretation:  Normal sinus rhythm with sinus arrhythmia Nonspecific T wave abnormality Abnormal ECG Confirmed by Raeford Razor [phone]) on 12/03/2017 5:53:13 PM       Radiology Dg Chest 2 View  Result Date: 12/03/2017 CLINICAL DATA:  Chest pain and shortness of breath EXAM: CHEST  2 VIEW COMPARISON:  April 30, 2017 FINDINGS: There is no edema or consolidation. The heart size and pulmonary vascularity are normal. No adenopathy. There are surgical clips at the gastroesophageal junction. No evident bone lesions. IMPRESSION: No edema or consolidation. Electronically Signed   By: Bretta Bang III M.D.   On: 12/03/2017 15:14    Procedures Procedures (including critical care time)  Medications Ordered in ED Medications - No data to display   Initial Impression / Assessment and Plan / ED Course  I have reviewed the triage vital signs and the nursing notes.  Pertinent labs & imaging results that were available during my care of the patient were reviewed by me and considered in my medical decision making (see chart for details).     72 year old male with hypertension.  His medication list states lisinopril, metoprolol and amlodipine.  He tells me that currently he is only taking 10 mg of amlodipine.  He is not exactly sure about this though.  Will provide him for a prescription to get back on the lisinopril.  He has no evidence of acute endorgan damage.  CP sounds atypical.  Doubt ACS, PE, dissection or other emergent process.  Advised to follow back up with his  PCP.  Final Clinical Impressions(s) / ED Diagnoses   Final diagnoses:  Hypertension, unspecified type    ED Discharge Orders    None       Raeford Razor, MD 12/03/17 1816

## 2017-12-03 NOTE — ED Triage Notes (Addendum)
Patient reports that he has chest pain at night when in bed. Patient reports that he was on 10mg  lisin[place]ril daily. [place]  because his BP was low.

## 2018-09-27 ENCOUNTER — Other Ambulatory Visit: Payer: Self-pay | Admitting: Internal Medicine

## 2018-09-27 DIAGNOSIS — I739 Peripheral vascular disease, unspecified: Secondary | ICD-10-CM

## 2018-10-04 ENCOUNTER — Ambulatory Visit
Admission: RE | Admit: 2018-10-04 | Discharge: 2018-10-04 | Disposition: A | Discharge status: Home / Self Care | Payer: Medicare Other | Source: Ambulatory Visit | Attending: Internal Medicine | Admitting: Internal Medicine

## 2018-10-04 DIAGNOSIS — I739 Peripheral vascular disease, unspecified: Secondary | ICD-10-CM

## 2018-11-04 ENCOUNTER — Encounter: Payer: Self-pay | Admitting: Vascular Surgery

## 2018-11-04 ENCOUNTER — Encounter

## 2018-11-04 ENCOUNTER — Other Ambulatory Visit: Payer: Self-pay

## 2018-11-04 ENCOUNTER — Ambulatory Visit (INDEPENDENT_AMBULATORY_CARE_PROVIDER_SITE_OTHER): Payer: Medicare Other | Admitting: Vascular Surgery

## 2018-11-04 DIAGNOSIS — I70212 Atherosclerosis of native arteries of extremities with intermittent claudication, left leg: Secondary | ICD-10-CM

## 2018-11-04 DIAGNOSIS — I70219 Atherosclerosis of native arteries of extremities with intermittent claudication, unspecified extremity: Secondary | ICD-10-CM | POA: Insufficient documentation

## 2018-11-04 MED ORDER — CILOSTAZOL 100 MG PO TABS: 100.0000 mg | ORAL_TABLET | Freq: Two times a day (BID) | ORAL | 11 refills | Status: AC

## 2018-11-04 NOTE — Progress Notes (Signed)
Patient name: Duane Long MRN: [id number] DOB: October 22, 1946 Sex: male  REASON FOR CONSULT: Left leg claudication and abnormal ABIs  HPI: Duane MANISCALCO is a 72 y.o. male, with history of hypertension that presents for evaluation of left leg pain and abnormal ABI's.  Patient states for the last 2 to 3 years he has pain in his left calf when he walks.  He particularly notes instances when he walks to the store about 3 blocks away and his left calf start burning.  The pain always stops when he stops walking.  He states he can walk about a block before the pain starts and usually tries to walk through it.  He denies any significant rest pain in the foot at night and denies any tissue loss.  He has never had any left lower extremity intervention.  He denies any significant problems with the right leg.  He denies tobacco abuse.  States he is on aspirin and statin.  Does feel that his claudication symptoms have progressed over the last few months.  Past Medical History:  Diagnosis Date  . Cataract   . Hypertension   . Urinary retention     Past Surgical History:  Procedure Laterality Date  . INCISION AND DRAINAGE PERIRECTAL ABSCESS N/A 08/03/2016   Procedure: IRRIGATION AND DEBRIDEMENT PERIRECTAL ABSCESS;  Surgeon: Almond Lint, MD;  Location: [facility] OR;  Service: General;  Laterality: N/A;  . peptic ulcer      No family history on file.  SOCIAL HISTORY: Social History   Socioeconomic History  . Marital status: Single    Spouse name: Not on file  . Number of children: Not on file  . Years of education: Not on file  . Highest education level: Not on file  Occupational History  . Not on file  Social Needs  . Financial resource strain: Not on file  . Food insecurity:    Worry: Not on file    Inability: Not on file  . Transportation needs:    Medical: Not on file    Non-medical: Not on file  Tobacco Use  . Smoking status: Former Smoker    Packs/day: 0.50    Last attempt to quit:  09/02/2017    Years since quitting: 1.1  . Smokeless tobacco: Never Used  Substance and Sexual Activity  . Alcohol use: No  . Drug use: Yes    Types: Marijuana  . Sexual activity: Not on file  Lifestyle  . Physical activity:    Days per week: Not on file    Minutes per session: Not on file  . Stress: Not on file  Relationships  . Social connections:    Talks on phone: Not on file    Gets together: Not on file    Attends religious service: Not on file    Active member of club or organization: Not on file    Attends meetings of clubs or organizations: Not on file    Relationship status: Not on file  . Intimate partner violence:    Fear of current or ex partner: Not on file    Emotionally abused: Not on file    Physically abused: Not on file    Forced sexual activity: Not on file  Other Topics Concern  . Not on file  Social History Narrative  . Not on file    No Known Allergies  Current Outpatient Medications  Medication Sig Dispense Refill  . alfuzosin (UROXATRAL) 10 MG 24 hr tablet  Take 10 mg by mouth daily with breakfast.    . amLODipine (NORVASC) 10 MG tablet Take 5 mg by mouth daily.    Marland Kitchen. aspirin EC 81 MG tablet Take 81 mg by mouth daily.    . cetirizine (ZYRTEC) 10 MG tablet Take 10 mg by mouth daily.    . finasteride (PROSCAR) 5 MG tablet Take 5 mg by mouth daily.    Marland Kitchen. ibuprofen (ADVIL,MOTRIN) 200 MG tablet Take 400 mg by mouth every 6 (six) hours as needed for moderate pain.    Marland Kitchen. lisinopril (PRINIVIL,ZESTRIL) 10 MG tablet Take 1 tablet (10 mg total) by mouth daily. 30 tablet 0  . metoprolol tartrate (LOPRESSOR) 25 MG tablet Take 25 mg by mouth 2 (two) times daily.    Marland Kitchen. oxyCODONE (OXY IR/ROXICODONE) 5 MG immediate release tablet Take 1-2 tablets (5-10 mg total) by mouth every 4 (four) hours as needed for moderate pain. (Patient not taking: Reported on 11/04/2018) 30 tablet 0   No current facility-administered medications for this visit.     REVIEW OF SYSTEMS:    [X]  denotes positive finding, [ ]  denotes negative finding Cardiac  Comments:  Chest pain or chest pressure:    Shortness of breath upon exertion:    Short of breath when lying flat:    Irregular heart rhythm:        Vascular    Pain in calf, thigh, or hip brought on by ambulation: x Left leg  Pain in feet at night that wakes you up from your sleep:     Blood clot in your veins:    Leg swelling:         Pulmonary    Oxygen at home:    Productive cough:     Wheezing:         Neurologic    Sudden weakness in arms or legs:     Sudden numbness in arms or legs:     Sudden onset of difficulty speaking or slurred speech:    Temporary loss of vision in one eye:     Problems with dizziness:         Gastrointestinal    Blood in stool:     Vomited blood:         Genitourinary    Burning when urinating:     Blood in urine:        Psychiatric    Major depression:         Hematologic    Bleeding problems:    Problems with blood clotting too easily:        Skin    Rashes or ulcers:        Constitutional    Fever or chills:      PHYSICAL EXAM: Vitals:   11/04/18 1025  BP: (!) 151/77  Pulse: 77  Resp: 20  Temp: 98.6 F (37 C)  SpO2: 98%  Weight: 140 lb (63.5 kg)  Height: 5\' 8"  (1.727 m)    GENERAL: The patient is a well-nourished male, in no acute distress. The vital signs are documented above. CARDIAC: There is a regular rate and rhythm.  VASCULAR:  2+ palpable radial pulse bilateral upper extremities 2+ palpable femoral pulses bilateral groins 2+ palpable right PT pulse No palpable left pedal pulses Feet warm, no tissue loss PULMONARY: There is good air exchange bilaterally without wheezing or rales. ABDOMEN: Soft and non-tender with normal pitched bowel sounds.  MUSCULOSKELETAL: There are no major deformities or cyanosis. NEUROLOGIC: No  focal weakness or paresthesias are detected. SKIN: There are no ulcers or rashes noted. PSYCHIATRIC: The patient has a  normal affect.  DATA:   I reviewed his noninvasive imaging that shows ABI of 0.8 on the right with only mild arterial disease and no drop in ABI with walking.  On the left he had significant drop in his ABI with walking 0.67 to 0.28.  Assessment/Plan:  I had a long discussion with Mr. Deboy about the etiology for claudication.  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  We discussed that typically claudication is not considered critical limb ischemia or immediate limb threatening situation.  Discussed options would be conservative measures with aspirin, statin, Pletal, and walking exercise versus scheduling for intervention if he feels that all conservative manners have been exhausted and the claudication is now lifestyle limiting.  Patient would like to try conservative measures for the time being and I did send a prescription for 100 mg of Pletal twice daily to his pharmacy.  Discussed that if his symptoms continue to progress or if he feels that this is failing we can set him up for aortogram and left lower extremity arteriogram and possible intervention.  I will plan to see him back in 3 months to monitor his progress.   Cephus Shellinghristopher J. Farren Nelles, MD [facility] Office: [phone] Pager: [phone]   Cephus Shellinghristopher J Vilda Zollner

## 2018-11-06 ENCOUNTER — Other Ambulatory Visit: Payer: Self-pay

## 2018-11-06 ENCOUNTER — Emergency Department (HOSPITAL_COMMUNITY)
Admission: EM | Admit: 2018-11-06 | Discharge: 2018-11-06 | Disposition: A | Discharge status: Home / Self Care | Payer: Medicare Other | Attending: Emergency Medicine | Admitting: Emergency Medicine

## 2018-11-06 ENCOUNTER — Encounter (HOSPITAL_COMMUNITY): Payer: Self-pay | Admitting: Emergency Medicine

## 2018-11-06 DIAGNOSIS — Z7982 Long term (current) use of aspirin: Secondary | ICD-10-CM | POA: Insufficient documentation

## 2018-11-06 DIAGNOSIS — Z87891 Personal history of nicotine dependence: Secondary | ICD-10-CM | POA: Diagnosis not present

## 2018-11-06 DIAGNOSIS — G4700 Insomnia, unspecified: Secondary | ICD-10-CM | POA: Diagnosis present

## 2018-11-06 DIAGNOSIS — F5101 Primary insomnia: Secondary | ICD-10-CM | POA: Diagnosis not present

## 2018-11-06 DIAGNOSIS — I10 Essential (primary) hypertension: Secondary | ICD-10-CM | POA: Insufficient documentation

## 2018-11-06 DIAGNOSIS — Z7902 Long term (current) use of antithrombotics/antiplatelets: Secondary | ICD-10-CM | POA: Diagnosis not present

## 2018-11-06 DIAGNOSIS — Z79899 Other long term (current) drug therapy: Secondary | ICD-10-CM | POA: Diagnosis not present

## 2018-11-06 MED ORDER — LORAZEPAM 1 MG PO TABS
1.0000 mg | ORAL_TABLET | Freq: Once | ORAL | Status: AC
  Administered 2018-11-06: 1 mg via ORAL
  Filled 2018-11-06: qty 1

## 2018-11-06 NOTE — Discharge Instructions (Addendum)
For your insomnia (difficulty sleeping):  STOP taking the Tylenol PM - this can have too much tylenol in it  INSTEAD, do the following: -At your local pharmacy, purchase MELATONIN (generic is OK) and UNISOM, or Doxylamine; these are both over-the-counter -Approximately 30 min before bed time, take the melatonin. THEN, turn the lights of your house down and turn off ALL SCREENS, including your phone and TV -This may be enough to re-set your sleep; if you continue having difficulty, take a UNISOM/DOXYLAMINE tablet with the melatonin prior ot bed -Do not drink alcohol before bed -No caffeine after noon

## 2018-11-06 NOTE — ED Notes (Signed)
Call Kirt BoysMolly (next to kin) for pt pick up [phone]

## 2018-11-06 NOTE — ED Notes (Signed)
Pt sleeping. 

## 2018-11-06 NOTE — ED Notes (Signed)
Pt up and dressed, alert, independent and ambulatory calling ride home. Cleared with EDP Isaacs pt is good for discharge. Pt declines discharge vital signs but voiced understanding of all discharge instructions.

## 2018-11-06 NOTE — ED Triage Notes (Signed)
Pt arrived [facility] from home for c/o insomnia. States that he hasnt been able to sleep in 4 days. Reportedly took 5 tylenol PM to help him sleep and it didn't work. Pt also reports chronic L armpit, back, and chest pain (currently wearing a heart monitor being followed by his providers) BP 190/96 P90 RR18 )2 100%RA

## 2018-11-06 NOTE — ED Notes (Signed)
ED Provider at bedside. 

## 2018-11-06 NOTE — ED Provider Notes (Signed)
[facility] Note   CSN: [id number] Arrival date & time: 11/06/18  0033     History   Chief Complaint Chief Complaint  Patient presents with  . Insomnia    HPI Duane GougeMarvin L Long is a 72 y.o. male.  HPI   72 yo M with h/o HTN here with insomnia. Pt reports that for the past several months, he's had progressively worsening difficulty sleeping. He has been taking two tylenol PM each night and sleeps no more than 3-4 hours. He lies in bed for a significant amount of time before he is able to fall asleep. He drinks coffee but in the morning only. No other med changes. No stimulant use. Denies any other acute complaints. He's had intermittent CP and palpitations that has bene ongoing and he currently has cardiac monitor in place. No HA. No focal numbness or weakness. No h/o thyroid problems.  Past Medical History:  Diagnosis Date  . Cataract   . Hypertension   . Urinary retention     Patient Active Problem List   Diagnosis Date Noted  . Atherosclerosis of native arteries of extremity with intermittent claudication (HCC) 11/04/2018  . Perianal abscess 08/03/2016  . Perirectal abscess 08/03/2016    Past Surgical History:  Procedure Laterality Date  . INCISION AND DRAINAGE PERIRECTAL ABSCESS N/A 08/03/2016   Procedure: IRRIGATION AND DEBRIDEMENT PERIRECTAL ABSCESS;  Surgeon: Almond LintFaera Byerly, MD;  Location: [facility] OR;  Service: General;  Laterality: N/A;  . peptic ulcer          Home Medications    Prior to Admission medications   Medication Sig Start Date End Date Taking? Authorizing Provider  alfuzosin (UROXATRAL) 10 MG 24 hr tablet Take 10 mg by mouth daily with breakfast.    [provider]  amLODipine (NORVASC) 10 MG tablet Take 5 mg by mouth daily.    [provider]  aspirin EC 81 MG tablet Take 81 mg by mouth daily.    [provider]  cetirizine (ZYRTEC) 10 MG tablet Take 10 mg by mouth daily.     [provider]  cilostazol (PLETAL) 100 MG tablet Take 1 tablet (100 mg total) by mouth 2 (two) times daily before a meal. 11/04/18   Cephus Shellinglark, Christopher J, MD  finasteride (PROSCAR) 5 MG tablet Take 5 mg by mouth daily.    [provider]  ibuprofen (ADVIL,MOTRIN) 200 MG tablet Take 400 mg by mouth every 6 (six) hours as needed for moderate pain.    [provider]  lisinopril (PRINIVIL,ZESTRIL) 10 MG tablet Take 1 tablet (10 mg total) by mouth daily. 12/03/17   Raeford RazorKohut, Stephen, MD  metoprolol tartrate (LOPRESSOR) 25 MG tablet Take 25 mg by mouth 2 (two) times daily.    [provider]  oxyCODONE (OXY IR/ROXICODONE) 5 MG immediate release tablet Take 1-2 tablets (5-10 mg total) by mouth every 4 (four) hours as needed for moderate pain. Patient not taking: Reported on 11/04/2018 08/04/16   Franne FortsMeuth, Brooke A, PA-C    Family History History reviewed. No pertinent family history.  Social History Social History   Tobacco Use  . Smoking status: Former Smoker    Packs/day: 0.50    Last attempt to quit: 09/02/2017    Years since quitting: 1.1  . Smokeless tobacco: Never Used  Substance Use Topics  . Alcohol use: No  . Drug use: Yes    Types: Marijuana     Allergies   Patient has no  known allergies.   Review of Systems Review of Systems  Constitutional: Positive for fatigue. Negative for chills and fever.  HENT: Negative for congestion and rhinorrhea.   Eyes: Negative for visual disturbance.  Respiratory: Negative for cough, shortness of breath and wheezing.   Cardiovascular: Negative for chest pain and leg swelling.  Gastrointestinal: Negative for abdominal pain, diarrhea, nausea and vomiting.  Genitourinary: Negative for dysuria and flank pain.  Musculoskeletal: Negative for neck pain and neck stiffness.  Skin: Negative for rash and wound.  Allergic/Immunologic: Negative for immunocompromised state.  Neurological: Negative for syncope, weakness  and headaches.  Psychiatric/Behavioral: Positive for sleep disturbance.  All other systems reviewed and are negative.    Physical Exam Updated Vital Signs BP (!) 163/70   Pulse 89   Resp 17   SpO2 98%   Physical Exam Vitals signs and nursing note reviewed.  Constitutional:      General: He is not in acute distress.    Appearance: He is well-developed.  HENT:     Head: Normocephalic and atraumatic.  Eyes:     Conjunctiva/sclera: Conjunctivae normal.  Neck:     Musculoskeletal: Neck supple.  Cardiovascular:     Rate and Rhythm: Normal rate and regular rhythm.     Heart sounds: Normal heart sounds. No murmur. No friction rub.  Pulmonary:     Effort: Pulmonary effort is normal. No respiratory distress.     Breath sounds: Normal breath sounds. No wheezing or rales.  Abdominal:     General: There is no distension.     Palpations: Abdomen is soft.     Tenderness: There is no abdominal tenderness.  Skin:    General: Skin is warm.     Capillary Refill: Capillary refill takes less than 2 seconds.  Neurological:     Mental Status: He is alert and oriented to person, place, and time.     Motor: No abnormal muscle tone.      ED Treatments / Results  Labs (all labs ordered are listed, but only abnormal results are displayed) Labs Reviewed - No data to display  EKG EKG Interpretation  Date/Time:  Sunday November 06 2018 00:42:03 EST Ventricular Rate:  88 PR Interval:    QRS Duration: 85 QT Interval:  361 QTC Calculation: 437 R Axis:   36 Text Interpretation:  Sinus rhythm Atrial premature complex No significant change since last tracing Confirmed by Deondra Wigger ([id number]) on 11/06/2018 1:55:45 AM   Radiology No results found.  Procedures Procedures (including critical care time)  Medications Ordered in ED Medications  LORazepam (ATIVAN) tablet 1 mg (1 mg Oral Given 11/06/18 0207)     Initial Impression / Assessment and Plan / ED Course  I have reviewed the  triage vital signs and the nursing notes.  Pertinent labs & imaging results that were available during my care of the patient were reviewed by me and considered in my medical decision making (see chart for details).     72  yo M here w/ insomnia x months, here now because he had difficulty sleeping tonight. This seems to be a primary sleep issue with poor sleep hygiene, possibly complicated by caffeine intake. He has no tachycardia or signs to suggest hyperthyroidism or metabolic defect. No focal neuro deficits, no headache. Denies drug use. Given that this is a chronic issue, do not feel emergent labs/imaging indicated. I spent >20 minutes discussing good sleep hygiene with pt. Of note, he's been taking tylenol PM daily. I discussed the  risks of doing so as well as risk of dependence, and he will stop. He initially reported taking 5 tylenol PM tonight but on further questioning, this is throughout day for a total of <3g tylenol today, doubt acute toxicity. Will have him start melatonin, and unisom ONLY as needed for a brief period. Will have him f/u with PCP re: anything stronger.  Final Clinical Impressions(s) / ED Diagnoses   Final diagnoses:  Primary insomnia    ED Discharge Orders    None       Shaune Pollack, MD 11/06/18 [phone]

## 2019-02-07 ENCOUNTER — Encounter (HOSPITAL_COMMUNITY): Payer: Medicare Other

## 2019-02-07 ENCOUNTER — Ambulatory Visit: Payer: Medicare Other | Admitting: Vascular Surgery

## 2019-02-24 ENCOUNTER — Encounter (INDEPENDENT_AMBULATORY_CARE_PROVIDER_SITE_OTHER): Payer: Self-pay
